# Patient Record
Sex: Male | Born: 1960 | Race: White | Hispanic: No | Smoking: Current every day smoker
Health system: Southern US, Community
[De-identification: ages and names within clinical notes are randomized; demographics above are authoritative.]

## PROBLEM LIST (undated history)

## (undated) DIAGNOSIS — E119 Type 2 diabetes mellitus without complications: Secondary | ICD-10-CM

## (undated) DIAGNOSIS — I10 Essential (primary) hypertension: Secondary | ICD-10-CM

## (undated) DIAGNOSIS — M1990 Unspecified osteoarthritis, unspecified site: Secondary | ICD-10-CM

## (undated) HISTORY — DX: Unspecified osteoarthritis, unspecified site: M19.90

---

## 2004-07-30 ENCOUNTER — Emergency Department (HOSPITAL_COMMUNITY): Admission: EM | Admit: 2004-07-30 | Discharge: 2004-07-30 | Payer: Self-pay | Admitting: Emergency Medicine

## 2005-05-20 ENCOUNTER — Emergency Department (HOSPITAL_COMMUNITY): Admission: EM | Admit: 2005-05-20 | Discharge: 2005-05-21 | Payer: Self-pay | Admitting: Emergency Medicine

## 2006-09-27 ENCOUNTER — Emergency Department (HOSPITAL_COMMUNITY): Admission: EM | Admit: 2006-09-27 | Discharge: 2006-09-27 | Payer: Self-pay | Admitting: Emergency Medicine
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
## 2009-04-03 ENCOUNTER — Observation Stay (HOSPITAL_COMMUNITY): Admission: EM | Admit: 2009-04-03 | Discharge: 2009-04-04 | Payer: Self-pay | Admitting: Emergency Medicine

## 2010-09-23 LAB — CBC
HCT: 41.2 % (ref 39.0–52.0)
HCT: 45.7 % (ref 39.0–52.0)
MCHC: 34.9 g/dL (ref 30.0–36.0)
MCHC: 34.6 g/dL (ref 30.0–36.0)
MCV: 94.2 fL (ref 78.0–100.0)
MCV: 94 fL (ref 78.0–100.0)
Platelets: 178 10*3/uL (ref 150–400)
RBC: 4.38 MIL/uL (ref 4.22–5.81)
RDW: 13.2 % (ref 11.5–15.5)
WBC: 7.4 10*3/uL (ref 4.0–10.5)
WBC: 9.7 10*3/uL (ref 4.0–10.5)

## 2010-09-23 LAB — BASIC METABOLIC PANEL
CO2: 28 mEq/L (ref 19–32)
Chloride: 107 mEq/L (ref 96–112)
GFR calc Af Amer: >60 mL/min (ref >60)
Potassium: 3.9 mEq/L (ref 3.5–5.1)

## 2010-09-23 LAB — SALICYLATE LEVEL: Salicylate Lvl: <4 mg/dL (ref 2.8–20.0)

## 2010-09-23 LAB — COMPREHENSIVE METABOLIC PANEL
Albumin: 4.5 g/dL (ref 3.5–5.2)
BUN: 17 mg/dL (ref 6–23)
Chloride: 105 mEq/L (ref 96–112)
Creatinine, Ser: 1.16 mg/dL (ref 0.4–1.5)
Total Bilirubin: 0.6 mg/dL (ref 0.3–1.2)

## 2010-09-23 LAB — DIFFERENTIAL
Basophils Absolute: 0.1 10*3/uL (ref 0.0–0.1)
Eosinophils Absolute: 0.1 10*3/uL (ref 0.0–0.7)
Eosinophils Relative: 2 % (ref 0–5)
Lymphocytes Relative: 18 % (ref 12–46)
Lymphs Abs: 2 10*3/uL (ref 0.7–4.0)
Monocytes Absolute: 0.6 10*3/uL (ref 0.1–1.0)
Monocytes Absolute: 0.7 10*3/uL (ref 0.1–1.0)
Monocytes Relative: 8 % (ref 3–12)
Neutro Abs: 7 10*3/uL (ref 1.7–7.7)

## 2010-09-23 LAB — GLUCOSE, CAPILLARY

## 2010-09-23 LAB — RAPID URINE DRUG SCREEN, HOSP PERFORMED
Amphetamines: NOT DETECTED
Barbiturates: NOT DETECTED
Benzodiazepines: POSITIVE — AB
Cocaine: NOT DETECTED
Opiates: NOT DETECTED

## 2011-11-11 ENCOUNTER — Telehealth: Payer: Self-pay

## 2011-11-11 DIAGNOSIS — Z139 Encounter for screening, unspecified: Secondary | ICD-10-CM

## 2011-11-11 NOTE — Telephone Encounter (Signed)
Day of prep, take 1/2 dose glipizide.

## 2011-11-11 NOTE — Telephone Encounter (Addendum)
Gastroenterology Pre-Procedure Form  Pt said he drinks a 12 pk beer on week-ends   Request Date: 11/11/2011     Requesting Physician: Dr. Regino Schultze     PATIENT INFORMATION:  Jason Graham is a 51 y.o., male (DOB=Jun 13, 1961).  PROCEDURE: Procedure(s) requested: colonoscopy Procedure Reason: screening for colon cancer  PATIENT REVIEW QUESTIONS: The patient reports the following:   1. Diabetes Melitis: yes  2. Joint replacements in the past 12 months: no 3. Major health problems in the past 3 months: no 4. Has an artificial valve or MVP:no 5. Has been advised in past to take antibiotics in advance of a procedure like teeth cleaning: no}    MEDICATIONS & ALLERGIES:    Patient reports the following regarding taking any blood thinners:   Plavix? no Aspirin?no Coumadin?  no  Patient confirms/reports the following medications:  Current Outpatient Prescriptions  Medication Sig Dispense Refill  . ALPRAZolam (XANAX) 0.5 MG tablet Take 0.5 mg by mouth at bedtime as needed.      Marland Kitchen glipiZIDE (GLUCOTROL) 5 MG tablet Take 5 mg by mouth 1 day or 1 dose.      . indomethacin (INDOCIN) 25 MG capsule Take 25 mg by mouth 1 day or 1 dose.      Marland Kitchen lisinopril (PRINIVIL,ZESTRIL) 10 MG tablet Take 10 mg by mouth daily.      . meloxicam (MOBIC) 7.5 MG tablet Take 7.5 mg by mouth daily. Two tabs daily      . pravastatin (PRAVACHOL) 20 MG tablet Take 20 mg by mouth daily.        Patient confirms/reports the following allergies:  No Known Allergies  Patient is appropriate to schedule for requested procedure(s):  AUTHORIZATION INFORMATION Primary Insurance:   ID #:   Group #:  Pre-Cert / Auth required: Pre-Cert / Auth #:   Secondary Insurance:   ID #:   Group #:  Pre-Cert / Auth required:  Pre-Cert / Auth #:  No orders of the defined types were placed in this encounter.    SCHEDULE INFORMATION: Procedure has been scheduled as follows:  Date: 12/14/2011                 Time:  8:30 AM Location:  [facility] Short Stay  This Gastroenterology Pre-Precedure Form is being routed to the following provider(s) for review: R. Roetta Sessions, MD    Rx and instructions mailed to pt.

## 2011-11-15 ENCOUNTER — Other Ambulatory Visit: Payer: Self-pay

## 2011-11-15 DIAGNOSIS — Z139 Encounter for screening, unspecified: Secondary | ICD-10-CM

## 2011-11-15 MED ORDER — PEG 3350-KCL-NA BICARB-NACL 420 G PO SOLR: ORAL | Status: AC

## 2011-11-29 ENCOUNTER — Telehealth: Payer: Self-pay

## 2011-11-29 NOTE — Telephone Encounter (Signed)
LMOM for a return call to update triage.  

## 2011-12-08 NOTE — Telephone Encounter (Signed)
LMOM to call to update triage prior to colonoscopy on 12/14/2011.

## 2011-12-13 NOTE — Telephone Encounter (Signed)
LMOM that I needed to review his meds etc prior to his procedure.

## 2011-12-14 ENCOUNTER — Encounter (HOSPITAL_COMMUNITY): Admission: RE | Payer: Self-pay | Source: Ambulatory Visit

## 2011-12-14 SURGERY — COLONOSCOPY
Anesthesia: Moderate Sedation

## 2011-12-14 MED ORDER — SODIUM CHLORIDE 0.45 % IV SOLN: Freq: Once | INTRAVENOUS | Status: DC

## 2011-12-15 ENCOUNTER — Ambulatory Visit (HOSPITAL_COMMUNITY)
Admission: RE | Admit: 2011-12-15 | Payer: BC Managed Care – PPO | Source: Ambulatory Visit | Admitting: Internal Medicine

## 2011-12-16 ENCOUNTER — Telehealth: Payer: Self-pay

## 2011-12-16 NOTE — Telephone Encounter (Signed)
Received a phone call from Selena Batten in Endo that pt was a no show for his colonoscopy on 01/13/2012. I had been unable to contact him by phone to update triage. i am mailing a letter to pt and also sending a letter to Dr. Regino Schultze.

## 2013-04-07 ENCOUNTER — Encounter (HOSPITAL_COMMUNITY): Admission: EM | Disposition: A | Discharge status: Home / Self Care | Payer: Self-pay | Source: Home / Self Care

## 2013-04-07 ENCOUNTER — Encounter (HOSPITAL_COMMUNITY): Payer: No Typology Code available for payment source | Admitting: Anesthesiology

## 2013-04-07 ENCOUNTER — Emergency Department (HOSPITAL_COMMUNITY): Payer: No Typology Code available for payment source

## 2013-04-07 ENCOUNTER — Inpatient Hospital Stay (HOSPITAL_COMMUNITY)
Admission: EM | Admit: 2013-04-07 | Discharge: 2013-04-11 | DRG: 330 | Disposition: A | Discharge status: Home / Self Care | Payer: No Typology Code available for payment source | Attending: General Surgery | Admitting: General Surgery

## 2013-04-07 ENCOUNTER — Encounter (HOSPITAL_COMMUNITY): Payer: Self-pay | Admitting: Emergency Medicine

## 2013-04-07 ENCOUNTER — Emergency Department (HOSPITAL_COMMUNITY): Payer: No Typology Code available for payment source | Admitting: Anesthesiology

## 2013-04-07 DIAGNOSIS — IMO0002 Reserved for concepts with insufficient information to code with codable children: Secondary | ICD-10-CM

## 2013-04-07 DIAGNOSIS — F17200 Nicotine dependence, unspecified, uncomplicated: Secondary | ICD-10-CM | POA: Diagnosis present

## 2013-04-07 DIAGNOSIS — S31109A Unspecified open wound of abdominal wall, unspecified quadrant without penetration into peritoneal cavity, initial encounter: Secondary | ICD-10-CM

## 2013-04-07 DIAGNOSIS — S3681XA Injury of peritoneum, initial encounter: Secondary | ICD-10-CM

## 2013-04-07 DIAGNOSIS — L408 Other psoriasis: Secondary | ICD-10-CM | POA: Diagnosis present

## 2013-04-07 DIAGNOSIS — E119 Type 2 diabetes mellitus without complications: Secondary | ICD-10-CM | POA: Diagnosis present

## 2013-04-07 DIAGNOSIS — I10 Essential (primary) hypertension: Secondary | ICD-10-CM | POA: Diagnosis present

## 2013-04-07 DIAGNOSIS — Y9229 Other specified public building as the place of occurrence of the external cause: Secondary | ICD-10-CM

## 2013-04-07 DIAGNOSIS — D62 Acute posthemorrhagic anemia: Secondary | ICD-10-CM | POA: Diagnosis present

## 2013-04-07 DIAGNOSIS — S31119A Laceration without foreign body of abdominal wall, unspecified quadrant without penetration into peritoneal cavity, initial encounter: Secondary | ICD-10-CM

## 2013-04-07 HISTORY — DX: Essential (primary) hypertension: I10

## 2013-04-07 HISTORY — PX: LAPAROTOMY: SHX154

## 2013-04-07 HISTORY — DX: Type 2 diabetes mellitus without complications: E11.9

## 2013-04-07 LAB — TYPE AND SCREEN
ABO/RH(D): A POS
Antibody Screen: NEGATIVE
Unit division: 0
Unit division: 0

## 2013-04-07 LAB — GLUCOSE, CAPILLARY: Glucose-Capillary: 335 mg/dL — ABNORMAL HIGH (ref 70–99)

## 2013-04-07 SURGERY — LAPAROTOMY, EXPLORATORY
Anesthesia: General | Site: Abdomen | Wound class: Dirty or Infected

## 2013-04-07 MED ORDER — PROPOFOL 10 MG/ML IV BOLUS
INTRAVENOUS | Status: DC | PRN
  Administered 2013-04-07: 200 mg via INTRAVENOUS

## 2013-04-07 MED ORDER — NALOXONE HCL 0.4 MG/ML IJ SOLN: 0.4000 mg | INTRAMUSCULAR | Status: DC | PRN

## 2013-04-07 MED ORDER — PROMETHAZINE HCL 25 MG/ML IJ SOLN: 6.2500 mg | INTRAMUSCULAR | Status: DC | PRN

## 2013-04-07 MED ORDER — ENOXAPARIN SODIUM 40 MG/0.4ML ~~LOC~~ SOLN
40.0000 mg | SUBCUTANEOUS | Status: DC
  Filled 2013-04-07: qty 0.4

## 2013-04-07 MED ORDER — POTASSIUM CHLORIDE IN NACL 20-0.9 MEQ/L-% IV SOLN
INTRAVENOUS | Status: DC
  Administered 2013-04-08 (×3): via INTRAVENOUS
  Administered 2013-04-09: 50 mL/h via INTRAVENOUS
  Administered 2013-04-09 – 2013-04-10 (×2): via INTRAVENOUS
  Administered 2013-04-10: 20 mL/h via INTRAVENOUS
  Filled 2013-04-07 (×9): qty 1000

## 2013-04-07 MED ORDER — OXYCODONE HCL 5 MG PO TABS: 5.0000 mg | ORAL_TABLET | Freq: Once | ORAL | Status: AC | PRN

## 2013-04-07 MED ORDER — SUCCINYLCHOLINE CHLORIDE 20 MG/ML IJ SOLN
INTRAMUSCULAR | Status: DC | PRN
  Administered 2013-04-07: 140 mg via INTRAVENOUS

## 2013-04-07 MED ORDER — 0.9 % SODIUM CHLORIDE (POUR BTL) OPTIME
TOPICAL | Status: DC | PRN
  Administered 2013-04-07: 3000 mL

## 2013-04-07 MED ORDER — ONDANSETRON HCL 4 MG/2ML IJ SOLN
INTRAMUSCULAR | Status: DC | PRN
  Administered 2013-04-07: 4 mg via INTRAMUSCULAR

## 2013-04-07 MED ORDER — MORPHINE SULFATE (PF) 1 MG/ML IV SOLN
INTRAVENOUS | Status: DC
  Administered 2013-04-08: 21 mg via INTRAVENOUS
  Administered 2013-04-08: 01:00:00 via INTRAVENOUS
  Administered 2013-04-08: 10.5 mg via INTRAVENOUS
  Administered 2013-04-08: 1 mg via INTRAVENOUS
  Administered 2013-04-08: 0.6 mg via INTRAVENOUS
  Administered 2013-04-08: 8.93 mg via INTRAVENOUS
  Administered 2013-04-08: 11:00:00 via INTRAVENOUS
  Administered 2013-04-08: 20.38 mg via INTRAVENOUS
  Administered 2013-04-09: 6 mg via INTRAVENOUS
  Administered 2013-04-09: 9 mL via INTRAVENOUS
  Administered 2013-04-09: 11:00:00 via INTRAVENOUS
  Administered 2013-04-09: 6 mg via INTRAVENOUS
  Filled 2013-04-07 (×4): qty 25

## 2013-04-07 MED ORDER — HYDROMORPHONE HCL PF 1 MG/ML IJ SOLN
0.2500 mg | INTRAMUSCULAR | Status: DC | PRN
  Administered 2013-04-08 (×2): 0.5 mg via INTRAVENOUS

## 2013-04-07 MED ORDER — FENTANYL CITRATE 0.05 MG/ML IJ SOLN
INTRAMUSCULAR | Status: DC | PRN
  Administered 2013-04-07: 50 ug via INTRAVENOUS
  Administered 2013-04-07: 100 ug via INTRAVENOUS
  Administered 2013-04-07 (×4): 50 ug via INTRAVENOUS

## 2013-04-07 MED ORDER — ALBUMIN HUMAN 5 % IV SOLN
INTRAVENOUS | Status: DC | PRN
  Administered 2013-04-07: 23:00:00 via INTRAVENOUS

## 2013-04-07 MED ORDER — VECURONIUM BROMIDE 10 MG IV SOLR
INTRAVENOUS | Status: DC | PRN
  Administered 2013-04-07: 3 mg via INTRAVENOUS

## 2013-04-07 MED ORDER — DIPHENHYDRAMINE HCL 50 MG/ML IJ SOLN: 12.5000 mg | Freq: Four times a day (QID) | INTRAMUSCULAR | Status: DC | PRN

## 2013-04-07 MED ORDER — NEOSTIGMINE METHYLSULFATE 1 MG/ML IJ SOLN
INTRAMUSCULAR | Status: DC | PRN
  Administered 2013-04-07: 4 mg via INTRAVENOUS

## 2013-04-07 MED ORDER — CEFAZOLIN SODIUM 1-5 GM-% IV SOLN
INTRAVENOUS | Status: AC
  Filled 2013-04-07: qty 100

## 2013-04-07 MED ORDER — ARTIFICIAL TEARS OP OINT
TOPICAL_OINTMENT | OPHTHALMIC | Status: DC | PRN
  Administered 2013-04-07: 1 via OPHTHALMIC

## 2013-04-07 MED ORDER — OXYCODONE HCL 5 MG/5ML PO SOLN: 5.0000 mg | Freq: Once | ORAL | Status: AC | PRN

## 2013-04-07 MED ORDER — ONDANSETRON HCL 4 MG/2ML IJ SOLN: 4.0000 mg | Freq: Four times a day (QID) | INTRAMUSCULAR | Status: DC | PRN

## 2013-04-07 MED ORDER — INSULIN ASPART 100 UNIT/ML ~~LOC~~ SOLN
0.0000 [IU] | SUBCUTANEOUS | Status: DC
  Administered 2013-04-08: 4 [IU] via SUBCUTANEOUS
  Administered 2013-04-08: 7 [IU] via SUBCUTANEOUS
  Administered 2013-04-08: 15 [IU] via SUBCUTANEOUS
  Administered 2013-04-08: 3 [IU] via SUBCUTANEOUS
  Administered 2013-04-08: 11 [IU] via SUBCUTANEOUS
  Administered 2013-04-08: 7 [IU] via SUBCUTANEOUS
  Administered 2013-04-09 (×2): 3 [IU] via SUBCUTANEOUS
  Administered 2013-04-09 (×2): 4 [IU] via SUBCUTANEOUS
  Administered 2013-04-09 (×2): 3 [IU] via SUBCUTANEOUS
  Administered 2013-04-10: 4 [IU] via SUBCUTANEOUS
  Administered 2013-04-10: 7 [IU] via SUBCUTANEOUS
  Administered 2013-04-10: 3 [IU] via SUBCUTANEOUS

## 2013-04-07 MED ORDER — MIDAZOLAM HCL 5 MG/5ML IJ SOLN
INTRAMUSCULAR | Status: DC | PRN
  Administered 2013-04-07: 2 mg via INTRAVENOUS

## 2013-04-07 MED ORDER — SODIUM CHLORIDE 0.9 % IJ SOLN: 9.0000 mL | INTRAMUSCULAR | Status: DC | PRN

## 2013-04-07 MED ORDER — EPHEDRINE SULFATE 50 MG/ML IJ SOLN
INTRAMUSCULAR | Status: DC | PRN
  Administered 2013-04-07: 10 mg via INTRAVENOUS

## 2013-04-07 MED ORDER — PHENYLEPHRINE HCL 10 MG/ML IJ SOLN
INTRAMUSCULAR | Status: DC | PRN
  Administered 2013-04-07 (×3): 80 ug via INTRAVENOUS

## 2013-04-07 MED ORDER — LACTATED RINGERS IV SOLN
INTRAVENOUS | Status: DC | PRN
  Administered 2013-04-07: 22:00:00 via INTRAVENOUS

## 2013-04-07 MED ORDER — LIDOCAINE HCL (CARDIAC) 20 MG/ML IV SOLN
INTRAVENOUS | Status: DC | PRN
  Administered 2013-04-07: 50 mg via INTRAVENOUS

## 2013-04-07 MED ORDER — CEFAZOLIN SODIUM-DEXTROSE 2-3 GM-% IV SOLR
2.0000 g | INTRAVENOUS | Status: DC
  Filled 2013-04-07: qty 50

## 2013-04-07 MED ORDER — CEFAZOLIN SODIUM-DEXTROSE 2-3 GM-% IV SOLR
2.0000 g | Freq: Once | INTRAVENOUS | Status: AC
  Administered 2013-04-07: 2 g via INTRAVENOUS

## 2013-04-07 MED ORDER — GLYCOPYRROLATE 0.2 MG/ML IJ SOLN
INTRAMUSCULAR | Status: DC | PRN
  Administered 2013-04-07: 0.6 mg via INTRAVENOUS

## 2013-04-07 MED ORDER — DIPHENHYDRAMINE HCL 12.5 MG/5ML PO ELIX: 12.5000 mg | ORAL_SOLUTION | Freq: Four times a day (QID) | ORAL | Status: DC | PRN

## 2013-04-07 SURGICAL SUPPLY — 49 items
BLADE SURG ROTATE 9660 (MISCELLANEOUS) ×2 IMPLANT
CANISTER SUCTION 2500CC (MISCELLANEOUS) ×2 IMPLANT
COVER MAYO STAND STRL (DRAPES) IMPLANT
COVER SURGICAL LIGHT HANDLE (MISCELLANEOUS) ×2 IMPLANT
DRAPE LAPAROSCOPIC ABDOMINAL (DRAPES) ×2 IMPLANT
DRAPE PROXIMA HALF (DRAPES) IMPLANT
DRAPE UTILITY 15X26 W/TAPE STR (DRAPE) ×4 IMPLANT
DRAPE WARM FLUID 44X44 (DRAPE) ×2 IMPLANT
DRSG OPSITE POSTOP 4X10 (GAUZE/BANDAGES/DRESSINGS) IMPLANT
DRSG OPSITE POSTOP 4X8 (GAUZE/BANDAGES/DRESSINGS) IMPLANT
DRSG PAD ABDOMINAL 8X10 ST (GAUZE/BANDAGES/DRESSINGS) ×4 IMPLANT
ELECT BLADE 6.5 EXT (BLADE) IMPLANT
ELECT CAUTERY BLADE 6.4 (BLADE) ×2 IMPLANT
ELECT REM PT RETURN 9FT ADLT (ELECTROSURGICAL) ×2
ELECTRODE REM PT RTRN 9FT ADLT (ELECTROSURGICAL) ×1 IMPLANT
GLOVE BIO SURGEON STRL SZ8 (GLOVE) ×2 IMPLANT
GLOVE BIOGEL PI IND STRL 7.0 (GLOVE) ×1 IMPLANT
GLOVE BIOGEL PI IND STRL 8 (GLOVE) ×2 IMPLANT
GLOVE BIOGEL PI INDICATOR 7.0 (GLOVE) ×1
GLOVE BIOGEL PI INDICATOR 8 (GLOVE) ×2
GLOVE SURG SIGNA 7.5 PF LTX (GLOVE) ×2 IMPLANT
GOWN STRL NON-REIN LRG LVL3 (GOWN DISPOSABLE) IMPLANT
GOWN STRL REIN 3XL LVL4 (GOWN DISPOSABLE) ×2 IMPLANT
GOWN STRL REIN XL XLG (GOWN DISPOSABLE) ×2 IMPLANT
KIT BASIN OR (CUSTOM PROCEDURE TRAY) ×2 IMPLANT
KIT ROOM TURNOVER OR (KITS) ×2 IMPLANT
LIGASURE IMPACT 36 18CM CVD LR (INSTRUMENTS) IMPLANT
NS IRRIG 1000ML POUR BTL (IV SOLUTION) ×2 IMPLANT
PACK GENERAL/GYN (CUSTOM PROCEDURE TRAY) ×2 IMPLANT
PAD ARMBOARD 7.5X6 YLW CONV (MISCELLANEOUS) ×2 IMPLANT
PENCIL BUTTON HOLSTER BLD 10FT (ELECTRODE) IMPLANT
SPECIMEN JAR LARGE (MISCELLANEOUS) IMPLANT
SPONGE GAUZE 4X4 12PLY (GAUZE/BANDAGES/DRESSINGS) ×4 IMPLANT
SPONGE LAP 18X18 X RAY DECT (DISPOSABLE) ×4 IMPLANT
STAPLER VISISTAT 35W (STAPLE) ×2 IMPLANT
SUCTION POOLE TIP (SUCTIONS) ×2 IMPLANT
SUT NOVA NAB DX-16 0-1 5-0 T12 (SUTURE) ×4 IMPLANT
SUT PDS AB 1 TP1 96 (SUTURE) ×4 IMPLANT
SUT SILK 2 0 SH CR/8 (SUTURE) IMPLANT
SUT SILK 2 0 TIES 10X30 (SUTURE) IMPLANT
SUT SILK 3 0 SH CR/8 (SUTURE) ×4 IMPLANT
SUT SILK 3 0 TIES 10X30 (SUTURE) ×2 IMPLANT
SUT VIC AB 3-0 SH 18 (SUTURE) IMPLANT
TAPE CLOTH SURG 4X10 WHT LF (GAUZE/BANDAGES/DRESSINGS) ×2 IMPLANT
TOWEL OR 17X24 6PK STRL BLUE (TOWEL DISPOSABLE) IMPLANT
TOWEL OR 17X26 10 PK STRL BLUE (TOWEL DISPOSABLE) ×2 IMPLANT
TRAY FOLEY CATH 16FRSI W/METER (SET/KITS/TRAYS/PACK) ×2 IMPLANT
TUBE CONNECTING 12X1/4 (SUCTIONS) IMPLANT
YANKAUER SUCT BULB TIP NO VENT (SUCTIONS) IMPLANT

## 2013-04-07 NOTE — Anesthesia Preprocedure Evaluation (Addendum)
Anesthesia Evaluation  Patient identified by MRN, date of birth, ID band Patient awake    Reviewed: Allergy & Precautions, H&P , NPO status , Patient's Chart, lab work & pertinent test results  Airway Mallampati: II TM Distance: <3 FB Neck ROM: Limited    Dental   Pulmonary COPDCurrent Smoker,  + rhonchi         Cardiovascular hypertension, Pt. on medications Rhythm:Regular Rate:Normal     Neuro/Psych    GI/Hepatic   Endo/Other  diabetes, Poorly Controlled, Type 2, Oral Hypoglycemic AgentsMorbid obesity  Renal/GU      Musculoskeletal   Abdominal (+) + obese,   Peds  Hematology   Anesthesia Other Findings   Reproductive/Obstetrics                          Anesthesia Physical Anesthesia Plan  ASA: III and emergent  Anesthesia Plan: General   Post-op Pain Management:    Induction: Intravenous, Rapid sequence and Cricoid pressure planned  Airway Management Planned: Oral ETT  Additional Equipment:   Intra-op Plan:   Post-operative Plan: Extubation in OR  Informed Consent: I have reviewed the patients History and Physical, chart, labs and discussed the procedure including the risks, benefits and alternatives for the proposed anesthesia with the patient or authorized representative who has indicated his/her understanding and acceptance.   History available from chart only  Plan Discussed with: CRNA and Surgeon  Anesthesia Plan Comments:        Anesthesia Quick Evaluation
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
## 2013-04-07 NOTE — Transfer of Care (Signed)
Immediate Anesthesia Transfer of Care Note  Patient: Jason Graham  Procedure(s) Performed: Procedure(s): EXPLORATORY LAPAROTOMY, REPAIR OF SMALL BOWEL ENTEROTOMY, REPAIR OF FASCIAL DEFECT (N/A)  Patient Location: PACU  Anesthesia Type:General  Level of Consciousness: awake  Airway & Oxygen Therapy: Patient Spontanous Breathing and Patient connected to nasal cannula oxygen  Post-op Assessment: Report given to PACU RN, Post -op Vital signs reviewed and stable and Patient moving all extremities  Post vital signs: Reviewed and stable  Complications: No apparent anesthesia complications
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
## 2013-04-07 NOTE — Anesthesia Procedure Notes (Signed)
Procedure Name: Intubation Date/Time: 04/07/2013 10:16 PM Performed by: Luster Landsberg Pre-anesthesia Checklist: Patient identified, Emergency Drugs available, Suction available and Patient being monitored Patient Re-evaluated:Patient Re-evaluated prior to inductionOxygen Delivery Method: Circle system utilized Preoxygenation: Pre-oxygenation with 100% oxygen Intubation Type: IV induction, Rapid sequence and Cricoid Pressure applied Laryngoscope Size: Mac and 3 Grade View: Grade II Tube type: Oral Tube size: 7.5 mm Number of attempts: 2 Airway Equipment and Method: Video-laryngoscopy and Stylet Placement Confirmation: ETT inserted through vocal cords under direct vision,  positive ETCO2 and breath sounds checked- equal and bilateral Secured at: 22 cm Tube secured with: Tape Dental Injury: Teeth and Oropharynx as per pre-operative assessment  Comments: Esophageal intubation with 1st DVL with MAC3 - DVL second time with glidescope.  +ETCO@ +/= BS. Teeth lips as preop.  Cricoid pressure maintained throughout procedure

## 2013-04-07 NOTE — Anesthesia Postprocedure Evaluation (Signed)
  Anesthesia Post-op Note  Patient: Jason Graham  Procedure(s) Performed: Procedure(s): EXPLORATORY LAPAROTOMY, REPAIR OF SMALL BOWEL ENTEROTOMY, REPAIR OF FASCIAL DEFECT (N/A)  Patient Location: PACU  Anesthesia Type:General  Level of Consciousness: awake  Airway and Oxygen Therapy: Patient Spontanous Breathing  Post-op Pain: none  Post-op Assessment: Post-op Vital signs reviewed, Patient's Cardiovascular Status Stable, Respiratory Function Stable, Patent Airway, No signs of Nausea or vomiting and Pain level controlled  Post-op Vital Signs: Reviewed and stable  Complications: No apparent anesthesia complications
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
## 2013-04-08 ENCOUNTER — Encounter (HOSPITAL_COMMUNITY): Payer: Self-pay | Admitting: Surgery

## 2013-04-08 DIAGNOSIS — E119 Type 2 diabetes mellitus without complications: Secondary | ICD-10-CM | POA: Insufficient documentation

## 2013-04-08 DIAGNOSIS — S3681XA Injury of peritoneum, initial encounter: Secondary | ICD-10-CM

## 2013-04-08 DIAGNOSIS — L409 Psoriasis, unspecified: Secondary | ICD-10-CM | POA: Insufficient documentation

## 2013-04-08 DIAGNOSIS — D62 Acute posthemorrhagic anemia: Secondary | ICD-10-CM

## 2013-04-08 DIAGNOSIS — S31119A Laceration without foreign body of abdominal wall, unspecified quadrant without penetration into peritoneal cavity, initial encounter: Secondary | ICD-10-CM

## 2013-04-08 DIAGNOSIS — IMO0002 Reserved for concepts with insufficient information to code with codable children: Secondary | ICD-10-CM

## 2013-04-08 LAB — CBC
HCT: 37.8 % — ABNORMAL LOW (ref 39.0–52.0)
Hemoglobin: 12.9 g/dL — ABNORMAL LOW (ref 13.0–17.0)
MCV: 91.5 fL (ref 78.0–100.0)
RBC: 4.13 MIL/uL — ABNORMAL LOW (ref 4.22–5.81)
RDW: 12.6 % (ref 11.5–15.5)
WBC: 9 10*3/uL (ref 4.0–10.5)

## 2013-04-08 LAB — GLUCOSE, CAPILLARY
Glucose-Capillary: 130 mg/dL — ABNORMAL HIGH (ref 70–99)
Glucose-Capillary: 143 mg/dL — ABNORMAL HIGH (ref 70–99)
Glucose-Capillary: 211 mg/dL — ABNORMAL HIGH (ref 70–99)

## 2013-04-08 LAB — ABO/RH: ABO/RH(D): A POS

## 2013-04-08 LAB — BASIC METABOLIC PANEL
Chloride: 101 mEq/L (ref 96–112)
GFR calc Af Amer: >90 mL/min (ref >90)
Potassium: 4.3 mEq/L (ref 3.5–5.1)
Sodium: 137 mEq/L (ref 135–145)

## 2013-04-08 MED ORDER — INSULIN ASPART 100 UNIT/ML ~~LOC~~ SOLN
SUBCUTANEOUS | Status: AC
  Filled 2013-04-08: qty 15

## 2013-04-08 MED ORDER — PANTOPRAZOLE SODIUM 40 MG IV SOLR
40.0000 mg | Freq: Every day | INTRAVENOUS | Status: DC
  Administered 2013-04-08 – 2013-04-09 (×3): 40 mg via INTRAVENOUS
  Filled 2013-04-08 (×4): qty 40

## 2013-04-08 MED ORDER — CEFAZOLIN SODIUM-DEXTROSE 2-3 GM-% IV SOLR
2.0000 g | Freq: Three times a day (TID) | INTRAVENOUS | Status: DC
  Administered 2013-04-08: 2 g via INTRAVENOUS
  Filled 2013-04-08 (×3): qty 50

## 2013-04-08 MED ORDER — HYDROMORPHONE HCL PF 1 MG/ML IJ SOLN
INTRAMUSCULAR | Status: AC
  Filled 2013-04-08: qty 1

## 2013-04-08 MED ORDER — MORPHINE SULFATE (PF) 1 MG/ML IV SOLN
INTRAVENOUS | Status: AC
  Filled 2013-04-08: qty 25

## 2013-04-08 MED ORDER — INSULIN DETEMIR 100 UNIT/ML ~~LOC~~ SOLN
20.0000 [IU] | Freq: Every day | SUBCUTANEOUS | Status: DC
  Administered 2013-04-09 (×2): 20 [IU] via SUBCUTANEOUS
  Filled 2013-04-08 (×3): qty 0.2

## 2013-04-08 MED ORDER — ONDANSETRON HCL 4 MG/2ML IJ SOLN: 4.0000 mg | Freq: Four times a day (QID) | INTRAMUSCULAR | Status: DC | PRN

## 2013-04-08 MED ORDER — CEFAZOLIN SODIUM-DEXTROSE 2-3 GM-% IV SOLR
2.0000 g | Freq: Three times a day (TID) | INTRAVENOUS | Status: DC
  Filled 2013-04-08 (×3): qty 50

## 2013-04-08 MED ORDER — ENOXAPARIN SODIUM 30 MG/0.3ML ~~LOC~~ SOLN
30.0000 mg | Freq: Two times a day (BID) | SUBCUTANEOUS | Status: DC
  Administered 2013-04-08 – 2013-04-11 (×7): 30 mg via SUBCUTANEOUS
  Filled 2013-04-08 (×7): qty 0.3
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
## 2013-04-08 NOTE — Progress Notes (Signed)
Glucose is high, will start some Levemir.  Large abdominal dressing with lots of tape.  Readjusted.  Will change again tomorrow.  This patient has been seen and I agree with the findings and treatment plan.  Marta Lamas. Gae Bon, MD, FACS [phone] (pager) [phone] (direct pager) Trauma Surgeon
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
## 2013-04-08 NOTE — ED Provider Notes (Signed)
I saw and evaluated the patient, reviewed the resident's note and I agree with the findings and plan.    Candyce Churn, MD 04/08/13 1300

## 2013-04-08 NOTE — Progress Notes (Signed)
Inpatient Diabetes Program Recommendations  AACE/ADA: New Consensus Statement on Inpatient Glycemic Control (2013)  Target Ranges:  Prepandial:   less than 140 mg/dL      Peak postprandial:   less than 180 mg/dL (1-2 hours)      Critically ill patients:  140 - 180 mg/dL   Hyperglycemia: Pt is medicaid potential. Pt would be a candidate for the [facility] Will request care management consult.  While here: Inpatient Diabetes Program Recommendations Insulin - Basal: Please consider addition of basal lantus at 10-15 units daily or HS  Thank you, Lenor Coffin, RN, CNS, Diabetes Coordinator [phone])

## 2013-04-08 NOTE — Care Management Note (Signed)
  Page 1 of 1   04/08/2013     3:25:23 PM   CARE MANAGEMENT NOTE 04/08/2013  Patient:  Jason Graham, Jason Graham   Account Number:  [id number]  Date Initiated:  04/08/2013  Documentation initiated by:  Ronny Flurry  Subjective/Objective Assessment:     Action/Plan:   Anticipated DC Date:     Anticipated DC Plan:           Choice offered to / List presented to:             Status of service:   Medicare Important Message given?   (If response is "NO", the following Medicare IM given date fields will be blank) Date Medicare IM given:   Date Additional Medicare IM given:    Discharge Disposition:    Per UR Regulation:    If discussed at Long Length of Stay Meetings, dates discussed:    Comments:  04-08-13 Patient wants to have follow up appointment at [facility] and [facility] . Information provided . Emailed  [facility] for appointment will let patient know when have response . Ronny Flurry RN BSN [phone]

## 2013-04-09 LAB — GLUCOSE, CAPILLARY
Glucose-Capillary: 140 mg/dL — ABNORMAL HIGH (ref 70–99)
Glucose-Capillary: 138 mg/dL — ABNORMAL HIGH (ref 70–99)
Glucose-Capillary: 174 mg/dL — ABNORMAL HIGH (ref 70–99)

## 2013-04-09 LAB — CBC
HCT: 38.7 % — ABNORMAL LOW (ref 39.0–52.0)
Hemoglobin: 13 g/dL (ref 13.0–17.0)
MCH: 31.9 pg (ref 26.0–34.0)
MCHC: 33.6 g/dL (ref 30.0–36.0)
RDW: 12.9 % (ref 11.5–15.5)

## 2013-04-09 MED ORDER — MORPHINE SULFATE 2 MG/ML IJ SOLN: 2.0000 mg | INTRAMUSCULAR | Status: DC | PRN

## 2013-04-09 MED ORDER — BIOTENE DRY MOUTH MT LIQD
15.0000 mL | Freq: Two times a day (BID) | OROMUCOSAL | Status: DC
  Administered 2013-04-09 – 2013-04-10 (×4): 15 mL via OROMUCOSAL

## 2013-04-09 MED ORDER — LISINOPRIL 10 MG PO TABS
10.0000 mg | ORAL_TABLET | Freq: Every day | ORAL | Status: DC
  Administered 2013-04-09 – 2013-04-11 (×3): 10 mg via ORAL
  Filled 2013-04-09 (×3): qty 1

## 2013-04-09 MED ORDER — BISOPROLOL-HYDROCHLOROTHIAZIDE 5-6.25 MG PO TABS
1.0000 | ORAL_TABLET | Freq: Every day | ORAL | Status: DC
  Administered 2013-04-10 – 2013-04-11 (×2): 1 via ORAL
  Filled 2013-04-09 (×2): qty 1

## 2013-04-09 MED ORDER — OXYCODONE HCL 5 MG PO TABS
15.0000 mg | ORAL_TABLET | ORAL | Status: DC | PRN
Start: 2013-04-09 — End: 2013-04-11
  Administered 2013-04-09: 15 mg via ORAL

## 2013-04-09 MED ORDER — NAPROXEN 500 MG PO TABS
500.0000 mg | ORAL_TABLET | Freq: Two times a day (BID) | ORAL | Status: DC
  Administered 2013-04-09 – 2013-04-11 (×4): 500 mg via ORAL
  Filled 2013-04-09 (×7): qty 1

## 2013-04-09 MED ORDER — GLIPIZIDE 5 MG PO TABS
5.0000 mg | ORAL_TABLET | Freq: Two times a day (BID) | ORAL | Status: DC
  Administered 2013-04-09 – 2013-04-11 (×4): 5 mg via ORAL
  Filled 2013-04-09 (×7): qty 1

## 2013-04-09 MED ORDER — OXYCODONE HCL 5 MG PO TABS
10.0000 mg | ORAL_TABLET | ORAL | Status: DC | PRN
  Administered 2013-04-10 – 2013-04-11 (×2): 10 mg via ORAL
  Filled 2013-04-09 (×2): qty 2

## 2013-04-09 MED ORDER — CHLORHEXIDINE GLUCONATE 0.12 % MT SOLN
15.0000 mL | Freq: Two times a day (BID) | OROMUCOSAL | Status: DC
  Administered 2013-04-09 – 2013-04-11 (×4): 15 mL via OROMUCOSAL
  Filled 2013-04-09 (×4): qty 15

## 2013-04-09 MED ORDER — OXYCODONE HCL 5 MG PO TABS
20.0000 mg | ORAL_TABLET | ORAL | Status: DC | PRN
  Administered 2013-04-09 – 2013-04-11 (×3): 20 mg via ORAL
  Filled 2013-04-09 (×4): qty 4

## 2013-04-09 MED ORDER — OXYCODONE HCL 5 MG PO TABS: 10.0000 mg | ORAL_TABLET | ORAL | Status: DC | PRN
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
## 2013-04-09 NOTE — Progress Notes (Signed)
Tolerating some clears. Up in chair. Patient examined and I agree with the assessment and plan  Violeta Gelinas, MD, MPH, FACS Pager: [phone]  04/09/2013 2:06 PM
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
## 2013-04-10 LAB — GLUCOSE, CAPILLARY
Glucose-Capillary: 102 mg/dL — ABNORMAL HIGH (ref 70–99)
Glucose-Capillary: 109 mg/dL — ABNORMAL HIGH (ref 70–99)
Glucose-Capillary: 215 mg/dL — ABNORMAL HIGH (ref 70–99)

## 2013-04-10 MED ORDER — PANTOPRAZOLE SODIUM 40 MG PO TBEC
40.0000 mg | DELAYED_RELEASE_TABLET | Freq: Every day | ORAL | Status: DC
  Administered 2013-04-10: 40 mg via ORAL
  Filled 2013-04-10: qty 1

## 2013-04-10 MED ORDER — INSULIN DETEMIR 100 UNIT/ML ~~LOC~~ SOLN
15.0000 [IU] | Freq: Two times a day (BID) | SUBCUTANEOUS | Status: DC
  Administered 2013-04-10 – 2013-04-11 (×2): 15 [IU] via SUBCUTANEOUS
  Filled 2013-04-10 (×3): qty 0.15
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
## 2013-04-10 NOTE — Progress Notes (Signed)
Patient doing okay.  No bowel movement.  Some flatus.  CPM otherwise.  May be able to increase diet tomorrow.  This patient has been seen and I agree with the findings and treatment plan.  Marta Lamas. Gae Bon, MD, FACS [phone] (pager) [phone] (direct pager) Trauma Surgeon

## 2013-04-11 LAB — GLUCOSE, CAPILLARY
Glucose-Capillary: 82 mg/dL (ref 70–99)
Glucose-Capillary: 113 mg/dL — ABNORMAL HIGH (ref 70–99)

## 2013-04-11 MED ORDER — DOCUSATE SODIUM 100 MG PO CAPS
100.0000 mg | ORAL_CAPSULE | Freq: Two times a day (BID) | ORAL | Status: DC
  Administered 2013-04-11: 100 mg via ORAL
  Filled 2013-04-11: qty 1

## 2013-04-11 MED ORDER — POLYETHYLENE GLYCOL 3350 17 G PO PACK
17.0000 g | PACK | Freq: Every day | ORAL | Status: DC
  Administered 2013-04-11: 17 g via ORAL
  Filled 2013-04-11: qty 1

## 2013-04-11 MED ORDER — INSULIN ASPART 100 UNIT/ML ~~LOC~~ SOLN
0.0000 [IU] | Freq: Three times a day (TID) | SUBCUTANEOUS | Status: DC
  Administered 2013-04-11: 2 [IU] via SUBCUTANEOUS

## 2013-04-11 MED ORDER — OXYCODONE-ACETAMINOPHEN 10-325 MG PO TABS: 1.0000 | ORAL_TABLET | ORAL | Status: DC | PRN
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
## 2013-04-11 NOTE — Discharge Summary (Signed)
Okay to go home  Ranae Casebier O. Carter Kaman, III, MD, FACS [phone] Trauma Surgeon 
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
## 2013-04-11 NOTE — Progress Notes (Signed)
Discharge home. Home discharge instruction given, no question verbalized. 

## 2013-04-17 ENCOUNTER — Ambulatory Visit (INDEPENDENT_AMBULATORY_CARE_PROVIDER_SITE_OTHER): Payer: Self-pay | Admitting: Orthopedic Surgery

## 2013-04-17 ENCOUNTER — Encounter (INDEPENDENT_AMBULATORY_CARE_PROVIDER_SITE_OTHER): Payer: Self-pay

## 2013-04-17 VITALS — BP 130/90 | HR 72 | Temp 98.9°F | Resp 15 | Ht 68.0 in | Wt 198.6 lb

## 2013-04-17 DIAGNOSIS — Z5189 Encounter for other specified aftercare: Secondary | ICD-10-CM

## 2013-04-17 DIAGNOSIS — S31119D Laceration without foreign body of abdominal wall, unspecified quadrant without penetration into peritoneal cavity, subsequent encounter: Secondary | ICD-10-CM

## 2013-04-17 MED ORDER — TRAMADOL HCL 50 MG PO TABS: 50.0000 mg | ORAL_TABLET | Freq: Four times a day (QID) | ORAL | Status: AC | PRN
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
## 2013-04-17 NOTE — Patient Instructions (Signed)
Continue wound care as instructed. No lifting more than ten pounds for six weeks from date of surgery.

## 2013-05-02 ENCOUNTER — Inpatient Hospital Stay: Payer: Self-pay

## 2018-03-08 ENCOUNTER — Encounter (HOSPITAL_COMMUNITY): Payer: Self-pay | Admitting: Emergency Medicine

## 2018-03-08 ENCOUNTER — Other Ambulatory Visit: Payer: Self-pay

## 2018-03-08 ENCOUNTER — Emergency Department (HOSPITAL_COMMUNITY): Payer: Medicaid - Out of State

## 2018-03-08 ENCOUNTER — Inpatient Hospital Stay (HOSPITAL_COMMUNITY)
Admission: EM | Admit: 2018-03-08 | Discharge: 2018-03-11 | DRG: 603 | Disposition: A | Discharge status: Home / Self Care | Payer: Medicaid - Out of State | Attending: Family Medicine | Admitting: Family Medicine

## 2018-03-08 DIAGNOSIS — L409 Psoriasis, unspecified: Secondary | ICD-10-CM | POA: Diagnosis present

## 2018-03-08 DIAGNOSIS — W57XXXA Bitten or stung by nonvenomous insect and other nonvenomous arthropods, initial encounter: Secondary | ICD-10-CM | POA: Diagnosis present

## 2018-03-08 DIAGNOSIS — Z7984 Long term (current) use of oral hypoglycemic drugs: Secondary | ICD-10-CM

## 2018-03-08 DIAGNOSIS — L03116 Cellulitis of left lower limb: Secondary | ICD-10-CM | POA: Diagnosis not present

## 2018-03-08 DIAGNOSIS — L02416 Cutaneous abscess of left lower limb: Secondary | ICD-10-CM

## 2018-03-08 DIAGNOSIS — Z79899 Other long term (current) drug therapy: Secondary | ICD-10-CM

## 2018-03-08 DIAGNOSIS — E1151 Type 2 diabetes mellitus with diabetic peripheral angiopathy without gangrene: Secondary | ICD-10-CM | POA: Diagnosis present

## 2018-03-08 DIAGNOSIS — Z8249 Family history of ischemic heart disease and other diseases of the circulatory system: Secondary | ICD-10-CM

## 2018-03-08 DIAGNOSIS — M79606 Pain in leg, unspecified: Secondary | ICD-10-CM

## 2018-03-08 DIAGNOSIS — Z791 Long term (current) use of non-steroidal anti-inflammatories (NSAID): Secondary | ICD-10-CM

## 2018-03-08 DIAGNOSIS — Z833 Family history of diabetes mellitus: Secondary | ICD-10-CM

## 2018-03-08 DIAGNOSIS — Z720 Tobacco use: Secondary | ICD-10-CM

## 2018-03-08 DIAGNOSIS — M1990 Unspecified osteoarthritis, unspecified site: Secondary | ICD-10-CM | POA: Diagnosis present

## 2018-03-08 DIAGNOSIS — L02612 Cutaneous abscess of left foot: Principal | ICD-10-CM | POA: Diagnosis present

## 2018-03-08 DIAGNOSIS — E1142 Type 2 diabetes mellitus with diabetic polyneuropathy: Secondary | ICD-10-CM | POA: Diagnosis present

## 2018-03-08 DIAGNOSIS — Z9114 Patient's other noncompliance with medication regimen: Secondary | ICD-10-CM

## 2018-03-08 DIAGNOSIS — S80862A Insect bite (nonvenomous), left lower leg, initial encounter: Secondary | ICD-10-CM | POA: Diagnosis present

## 2018-03-08 DIAGNOSIS — I10 Essential (primary) hypertension: Secondary | ICD-10-CM | POA: Diagnosis not present

## 2018-03-08 DIAGNOSIS — Z23 Encounter for immunization: Secondary | ICD-10-CM

## 2018-03-08 DIAGNOSIS — E1165 Type 2 diabetes mellitus with hyperglycemia: Secondary | ICD-10-CM | POA: Diagnosis present

## 2018-03-08 DIAGNOSIS — E119 Type 2 diabetes mellitus without complications: Secondary | ICD-10-CM

## 2018-03-08 LAB — CBC
HCT: 37.7 % — ABNORMAL LOW (ref 39.0–52.0)
Hemoglobin: 12.7 g/dL — ABNORMAL LOW (ref 13.0–17.0)
MCH: 31.3 pg (ref 26.0–34.0)
MCHC: 33.7 g/dL (ref 30.0–36.0)
MCV: 92.9 fL (ref 78.0–100.0)
Platelets: 380 K/uL (ref 150–400)
RBC: 4.06 MIL/uL — ABNORMAL LOW (ref 4.22–5.81)
RDW: 12.4 % (ref 11.5–15.5)
WBC: 12.1 K/uL — ABNORMAL HIGH (ref 4.0–10.5)

## 2018-03-08 LAB — BASIC METABOLIC PANEL WITH GFR
Anion gap: 9 (ref 5–15)
BUN: 16 mg/dL (ref 6–20)
CO2: 27 mmol/L (ref 22–32)
Calcium: 9.1 mg/dL (ref 8.9–10.3)
Chloride: 103 mmol/L (ref 98–111)
Creatinine, Ser: 0.93 mg/dL (ref 0.61–1.24)
GFR calc Af Amer: >60 mL/min
GFR calc non Af Amer: >60 mL/min
Glucose, Bld: 150 mg/dL — ABNORMAL HIGH (ref 70–99)
Potassium: 4.1 mmol/L (ref 3.5–5.1)
Sodium: 139 mmol/L (ref 135–145)

## 2018-03-08 LAB — GLUCOSE, CAPILLARY: GLUCOSE-CAPILLARY: 224 mg/dL — AB (ref 70–99)

## 2018-03-08 LAB — CG4 I-STAT (LACTIC ACID): Lactic Acid, Venous: 1.85 mmol/L (ref 0.5–1.9)

## 2018-03-08 MED ORDER — VANCOMYCIN HCL IN DEXTROSE 1-5 GM/200ML-% IV SOLN
1000.0000 mg | Freq: Once | INTRAVENOUS | Status: AC
  Administered 2018-03-08: 1000 mg via INTRAVENOUS
  Filled 2018-03-08: qty 200

## 2018-03-08 MED ORDER — VANCOMYCIN HCL IN DEXTROSE 1-5 GM/200ML-% IV SOLN
1000.0000 mg | Freq: Three times a day (TID) | INTRAVENOUS | Status: DC
  Administered 2018-03-09 – 2018-03-11 (×6): 1000 mg via INTRAVENOUS
  Filled 2018-03-08 (×7): qty 200

## 2018-03-08 MED ORDER — PIPERACILLIN-TAZOBACTAM 3.375 G IVPB 30 MIN
3.3750 g | Freq: Once | INTRAVENOUS | Status: AC
  Administered 2018-03-08: 3.375 g via INTRAVENOUS
  Filled 2018-03-08: qty 50

## 2018-03-08 MED ORDER — INSULIN ASPART 100 UNIT/ML ~~LOC~~ SOLN
0.0000 [IU] | Freq: Every day | SUBCUTANEOUS | Status: DC
  Administered 2018-03-08 – 2018-03-10 (×3): 2 [IU] via SUBCUTANEOUS

## 2018-03-08 MED ORDER — PNEUMOCOCCAL VAC POLYVALENT 25 MCG/0.5ML IJ INJ
0.5000 mL | INJECTION | INTRAMUSCULAR | Status: AC
  Administered 2018-03-09: 0.5 mL via INTRAMUSCULAR
  Filled 2018-03-08: qty 0.5

## 2018-03-08 MED ORDER — ACETAMINOPHEN 325 MG PO TABS
650.0000 mg | ORAL_TABLET | Freq: Four times a day (QID) | ORAL | Status: DC | PRN
  Administered 2018-03-09: 650 mg via ORAL
  Filled 2018-03-08: qty 2

## 2018-03-08 MED ORDER — VANCOMYCIN HCL IN DEXTROSE 1-5 GM/200ML-% IV SOLN: 1000.0000 mg | Freq: Once | INTRAVENOUS | Status: DC

## 2018-03-08 MED ORDER — ACETAMINOPHEN 650 MG RE SUPP: 650.0000 mg | Freq: Four times a day (QID) | RECTAL | Status: DC | PRN

## 2018-03-08 MED ORDER — LIDOCAINE-EPINEPHRINE (PF) 2 %-1:200000 IJ SOLN
20.0000 mL | Freq: Once | INTRAMUSCULAR | Status: AC
  Administered 2018-03-08: 20 mL
  Filled 2018-03-08: qty 20

## 2018-03-08 MED ORDER — SENNOSIDES-DOCUSATE SODIUM 8.6-50 MG PO TABS: 1.0000 | ORAL_TABLET | Freq: Every evening | ORAL | Status: DC | PRN

## 2018-03-08 MED ORDER — LISINOPRIL 5 MG PO TABS
5.0000 mg | ORAL_TABLET | Freq: Every day | ORAL | Status: DC
  Administered 2018-03-08 – 2018-03-11 (×4): 5 mg via ORAL
  Filled 2018-03-08 (×4): qty 1

## 2018-03-08 MED ORDER — ENOXAPARIN SODIUM 40 MG/0.4ML ~~LOC~~ SOLN
40.0000 mg | SUBCUTANEOUS | Status: DC
  Administered 2018-03-09 – 2018-03-11 (×3): 40 mg via SUBCUTANEOUS
  Filled 2018-03-08 (×3): qty 0.4

## 2018-03-08 MED ORDER — SODIUM CHLORIDE 0.9 % IV SOLN
INTRAVENOUS | Status: DC | PRN
  Administered 2018-03-09: 500 mL via INTRAVENOUS

## 2018-03-08 MED ORDER — INSULIN ASPART 100 UNIT/ML ~~LOC~~ SOLN
0.0000 [IU] | Freq: Three times a day (TID) | SUBCUTANEOUS | Status: DC
  Administered 2018-03-09: 5 [IU] via SUBCUTANEOUS
  Administered 2018-03-09 – 2018-03-10 (×3): 3 [IU] via SUBCUTANEOUS
  Administered 2018-03-10 (×2): 5 [IU] via SUBCUTANEOUS
  Administered 2018-03-11: 3 [IU] via SUBCUTANEOUS
  Administered 2018-03-11: 5 [IU] via SUBCUTANEOUS
  Administered 2018-03-11: 7 [IU] via SUBCUTANEOUS

## 2018-03-08 MED ORDER — ONDANSETRON HCL 4 MG PO TABS: 4.0000 mg | ORAL_TABLET | Freq: Four times a day (QID) | ORAL | Status: DC | PRN

## 2018-03-08 MED ORDER — ONDANSETRON HCL 4 MG/2ML IJ SOLN: 4.0000 mg | Freq: Four times a day (QID) | INTRAMUSCULAR | Status: DC | PRN

## 2018-03-08 MED ORDER — TRAMADOL HCL 50 MG PO TABS: 50.0000 mg | ORAL_TABLET | Freq: Four times a day (QID) | ORAL | Status: DC | PRN

## 2018-03-08 MED ORDER — AMLODIPINE BESYLATE 5 MG PO TABS: 5.0000 mg | ORAL_TABLET | Freq: Every day | ORAL | Status: DC
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
## 2018-03-08 NOTE — Progress Notes (Addendum)
Pharmacy Note:  Initial antibiotic(s) regimen of Vancomycin and Zosyn ordered by EDP to treat cellulitis / insect bite.  CrCl cannot be calculated (Patient's most recent lab result is older than the maximum 21 days allowed.).   No Known Allergies  Vitals:   03/08/18 1902  BP: (!) 213/99  Pulse: 83  Resp: 18  Temp: 98.9 F (37.2 C)  SpO2: 100%    Anti-infectives (From admission, onward)   Start     Dose/Rate Route Frequency Ordered Stop   03/08/18 2015  piperacillin-tazobactam (ZOSYN) IVPB 3.375 g     3.375 g 100 mL/hr over 30 Minutes Intravenous  Once 03/08/18 2011        Antimicrobials this admission:  Vanc 9/19 >>  Zosyn 9/19 >>   Dose adjustments this admission:  n/a   Microbiology results:  9/19 BCx: pending  UCx:    Sputum:    MRSA PCR:   Plan: Initial dose(s) of Vancomycin and Zosyn X 1 ordered. F/U admission orders for further dosing if therapy continued.  Mady GemmaHayes, Annye Forrey R, [facility] Hospital[facility] 03/08/2018 8:30 PM   Vancomycin continued on admission. Vancomycin 1gm IV every 8 hours. Monitor labs, micro and vitals.   Mady GemmaHayes, Taneika Choi R, [facility] System[facility]PH 03/08/2018 10:06 PM
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
## 2018-03-08 NOTE — ED Provider Notes (Signed)
Medical screening examination/treatment/procedure(s) were conducted as a shared visit with non-physician practitioner(s) and myself.  I personally evaluated the patient during the encounter.  57 year old male with history of diabetes and peripheral neuropathy presents the emergency department today with left-sided leg pain and swelling and redness.  Been going on for couple weeks.  Started as a little hole but is progressively worsened. Had a elevated temperature at home with some nausea but no vomiting.  Just general malaise as well. Man with significant swollen left lower extremity with erythema all the way up below his knee.  Multiple ulcers on that same extremity.  Warm to touch. I suspect patient likely has a pretty significant cellulitis.  With his history of diabetes and the extent of the cellulitis will admit to hospitalist.  Would also benefit from ultrasound to rule out DVT however I think it is more likely infectious.   Marily MemosMesner, Aloise Copus, MD 03/09/18 [phone]

## 2018-03-09 ENCOUNTER — Observation Stay (HOSPITAL_COMMUNITY): Payer: Medicaid - Out of State

## 2018-03-09 DIAGNOSIS — L03116 Cellulitis of left lower limb: Secondary | ICD-10-CM | POA: Diagnosis not present

## 2018-03-09 DIAGNOSIS — E1151 Type 2 diabetes mellitus with diabetic peripheral angiopathy without gangrene: Secondary | ICD-10-CM | POA: Diagnosis present

## 2018-03-09 DIAGNOSIS — Z23 Encounter for immunization: Secondary | ICD-10-CM | POA: Diagnosis not present

## 2018-03-09 DIAGNOSIS — Z7984 Long term (current) use of oral hypoglycemic drugs: Secondary | ICD-10-CM | POA: Diagnosis not present

## 2018-03-09 DIAGNOSIS — L409 Psoriasis, unspecified: Secondary | ICD-10-CM | POA: Diagnosis present

## 2018-03-09 DIAGNOSIS — E1165 Type 2 diabetes mellitus with hyperglycemia: Secondary | ICD-10-CM | POA: Diagnosis present

## 2018-03-09 DIAGNOSIS — W57XXXA Bitten or stung by nonvenomous insect and other nonvenomous arthropods, initial encounter: Secondary | ICD-10-CM | POA: Diagnosis present

## 2018-03-09 DIAGNOSIS — S80862A Insect bite (nonvenomous), left lower leg, initial encounter: Secondary | ICD-10-CM | POA: Diagnosis present

## 2018-03-09 DIAGNOSIS — Z833 Family history of diabetes mellitus: Secondary | ICD-10-CM | POA: Diagnosis not present

## 2018-03-09 DIAGNOSIS — Z8249 Family history of ischemic heart disease and other diseases of the circulatory system: Secondary | ICD-10-CM | POA: Diagnosis not present

## 2018-03-09 DIAGNOSIS — Z720 Tobacco use: Secondary | ICD-10-CM | POA: Diagnosis not present

## 2018-03-09 DIAGNOSIS — L02612 Cutaneous abscess of left foot: Secondary | ICD-10-CM | POA: Diagnosis not present

## 2018-03-09 DIAGNOSIS — Z79899 Other long term (current) drug therapy: Secondary | ICD-10-CM | POA: Diagnosis not present

## 2018-03-09 DIAGNOSIS — Z791 Long term (current) use of non-steroidal anti-inflammatories (NSAID): Secondary | ICD-10-CM | POA: Diagnosis not present

## 2018-03-09 DIAGNOSIS — Z9114 Patient's other noncompliance with medication regimen: Secondary | ICD-10-CM | POA: Diagnosis not present

## 2018-03-09 DIAGNOSIS — L02416 Cutaneous abscess of left lower limb: Secondary | ICD-10-CM | POA: Diagnosis not present

## 2018-03-09 DIAGNOSIS — I10 Essential (primary) hypertension: Secondary | ICD-10-CM | POA: Diagnosis present

## 2018-03-09 DIAGNOSIS — E1142 Type 2 diabetes mellitus with diabetic polyneuropathy: Secondary | ICD-10-CM | POA: Diagnosis present

## 2018-03-09 DIAGNOSIS — M1990 Unspecified osteoarthritis, unspecified site: Secondary | ICD-10-CM | POA: Diagnosis present

## 2018-03-09 LAB — BASIC METABOLIC PANEL
Anion gap: 7 (ref 5–15)
BUN: 17 mg/dL (ref 6–20)
CHLORIDE: 102 mmol/L (ref 98–111)
CO2: 28 mmol/L (ref 22–32)
CREATININE: 0.88 mg/dL (ref 0.61–1.24)
Calcium: 8.6 mg/dL — ABNORMAL LOW (ref 8.9–10.3)
GFR calc Af Amer: >60 mL/min (ref >60)
GFR calc non Af Amer: >60 mL/min (ref >60)
GLUCOSE: 262 mg/dL — AB (ref 70–99)
POTASSIUM: 3.7 mmol/L (ref 3.5–5.1)
Sodium: 137 mmol/L (ref 135–145)

## 2018-03-09 LAB — LIPID PANEL
CHOL/HDL RATIO: 6.1 ratio
CHOLESTEROL: 159 mg/dL (ref 0–200)
HDL: 26 mg/dL — ABNORMAL LOW (ref >40)
LDL Cholesterol: 104 mg/dL — ABNORMAL HIGH (ref 0–99)
Triglycerides: 147 mg/dL (ref <150)
VLDL: 29 mg/dL (ref 0–40)

## 2018-03-09 LAB — GLUCOSE, CAPILLARY
GLUCOSE-CAPILLARY: 275 mg/dL — AB (ref 70–99)
GLUCOSE-CAPILLARY: 202 mg/dL — AB (ref 70–99)
GLUCOSE-CAPILLARY: 241 mg/dL — AB (ref 70–99)
Glucose-Capillary: 241 mg/dL — ABNORMAL HIGH (ref 70–99)

## 2018-03-09 LAB — HEMOGLOBIN A1C
Hgb A1c MFr Bld: 8.5 % — ABNORMAL HIGH (ref 4.8–5.6)
MEAN PLASMA GLUCOSE: 197.25 mg/dL

## 2018-03-09 MED ORDER — TRAZODONE HCL 50 MG PO TABS
150.0000 mg | ORAL_TABLET | Freq: Every day | ORAL | Status: DC
  Administered 2018-03-09 – 2018-03-10 (×2): 150 mg via ORAL
  Filled 2018-03-09 (×2): qty 3

## 2018-03-09 MED ORDER — INSULIN GLARGINE 100 UNIT/ML ~~LOC~~ SOLN
15.0000 [IU] | Freq: Every day | SUBCUTANEOUS | Status: DC
  Administered 2018-03-09: 15 [IU] via SUBCUTANEOUS
  Filled 2018-03-09 (×3): qty 0.15

## 2018-03-09 MED ORDER — INFLUENZA VAC SPLIT QUAD 0.5 ML IM SUSY
0.5000 mL | PREFILLED_SYRINGE | INTRAMUSCULAR | Status: AC
  Administered 2018-03-10: 0.5 mL via INTRAMUSCULAR
  Filled 2018-03-09: qty 0.5

## 2018-03-09 MED ORDER — HYDRALAZINE HCL 20 MG/ML IJ SOLN: 10.0000 mg | Freq: Four times a day (QID) | INTRAMUSCULAR | Status: DC | PRN

## 2018-03-09 MED ORDER — PIPERACILLIN-TAZOBACTAM 3.375 G IVPB
3.3750 g | Freq: Three times a day (TID) | INTRAVENOUS | Status: DC
  Administered 2018-03-09 – 2018-03-11 (×7): 3.375 g via INTRAVENOUS
  Filled 2018-03-09 (×8): qty 50

## 2018-03-09 NOTE — Progress Notes (Signed)
Pt states he changed his mind and would like to receive the flu vaccine. Order placed.

## 2018-03-09 NOTE — Progress Notes (Signed)
Inpatient Diabetes Program Recommendations  AACE/ADA: New Consensus Statement on Inpatient Glycemic Control (2015)  Target Ranges:  Prepandial:   less than 140 mg/dL      Peak postprandial:   less than 180 mg/dL (1-2 hours)      Critically ill patients:  140 - 180 mg/dL   Lab Results  Component Value Date   GLUCAP 202 (H) 03/09/2018    Review of Glycemic ControlResults for Jason Graham, Jamille (MRN [id number]) as of 03/09/2018 11:47  Ref. Range 03/08/2018 23:23 03/09/2018 07:34 03/09/2018 11:43  Glucose-Capillary Latest Ref Range: 70 - 99 mg/dL 782224 (H) 956275 (H) 213202 (H)    Diabetes history: Type 2 DM  Outpatient Diabetes medications: Metformin 1000 mg bid Current orders for Inpatient glycemic control:  Novolog sensitive tid with meals and HS  Inpatient Diabetes Program Recommendations:   A1C pending.  Please consider adding Lantus 15 units daily while in the hospital.   Thanks,  Beryl MeagerJenny Tynan Boesel, RN, BC-ADM Inpatient Diabetes Coordinator Pager [phone] (8a-5p)
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
## 2018-03-09 NOTE — Progress Notes (Signed)
Patient Demographics:    Jason Graham, is a 58 y.o. male, DOB - 1961-05-22, ZOX:[id number]  Admit date - 03/08/2018   Admitting Physician Synetta Fail, MD  Outpatient Primary MD for the patient is [name], MD  LOS - 0   Chief Complaint  Patient presents with  . Insect Bite        Subjective:    Jason Graham today has no fevers, no emesis,  No chest pain, sister at bedside, questions answered , no diarrhea  Assessment  & Plan :    Principal Problem:   Cellulitis and abscess of left leg Active Problems:   DM (diabetes mellitus) (HCC)   Foot abscess, left   Essential hypertension uncontrolled    1) left lower extremity cellulitis----wound care consult appreciated, discussed with Dr. Lovell Sheehan the general surgeon, no indication for further I&D at this time, continue Vanco, add Zosyn given that patient is diabetic risk for polymicrobial infection, culture results pending  2)DM2-hemoglobin A1c is 8.5, will start Lantus insulin 15 units nightly,, Use Novolog/Humalog Sliding scale insulin with Accu-Cheks/Fingersticks as ordered   3)HTN-continue lisinopril 5 mg daily   may use IV Hydralazine 10 mg  Every 4 hours Prn for systolic blood pressure over 160 mmhg  4)Lt leg wound care-continue with normal saline apply wet-to-dry dressing twice a day then apply Xeroform  5)Tobacco Abuse--arterial Dopplers with ABIs suggest mild arterial disease on the right and no significant arterial disease on the left, smoking cessation advised  Disposition/Need for in-Hospital Stay- patient unable to be discharged at this time due to significant cellulitis of the left lower extremity requiring IV antibiotics pending cultures  Code Status : Full code   Disposition Plan  : Home  Consults  :  Curbside consultation with general surgeon Dr. Lovell Sheehan   DVT Prophylaxis  :  Lovenox    Lab Results  Component  Value Date   PLT 380 03/08/2018    Inpatient Medications  Scheduled Meds: . enoxaparin (LOVENOX) injection  40 mg Subcutaneous Q24H  . [START ON 03/10/2018] Influenza vac split quadrivalent PF  0.5 mL Intramuscular Tomorrow-1000  . insulin aspart  0-5 Units Subcutaneous QHS  . insulin aspart  0-9 Units Subcutaneous TID WC  . lisinopril  5 mg Oral Daily   Continuous Infusions: . sodium chloride 500 mL (03/09/18 1119)  . piperacillin-tazobactam (ZOSYN)  IV 3.375 g (03/09/18 1121)  . vancomycin 1,000 mg (03/09/18 1748)   PRN Meds:.sodium chloride, acetaminophen **OR** acetaminophen, ondansetron **OR** ondansetron (ZOFRAN) IV, senna-docusate, traMADol    Anti-infectives (From admission, onward)   Start     Dose/Rate Route Frequency Ordered Stop   03/09/18 1100  piperacillin-tazobactam (ZOSYN) IVPB 3.375 g     3.375 g 12.5 mL/hr over 240 Minutes Intravenous Every 8 hours 03/09/18 1017     03/09/18 0600  vancomycin (VANCOCIN) IVPB 1000 mg/200 mL premix     1,000 mg 200 mL/hr over 60 Minutes Intravenous Every 8 hours 03/08/18 2207     03/09/18 0500  vancomycin (VANCOCIN) IVPB 1000 mg/200 mL premix  Status:  Discontinued     1,000 mg 200 mL/hr over 60 Minutes Intravenous  Once 03/08/18 2308 03/08/18 2315   03/08/18 2045  vancomycin (VANCOCIN) IVPB 1000 mg/200 mL premix  1,000 mg 200 mL/hr over 60 Minutes Intravenous  Once 03/08/18 2032 03/08/18 2219   03/08/18 2015  piperacillin-tazobactam (ZOSYN) IVPB 3.375 g     3.375 g 100 mL/hr over 30 Minutes Intravenous  Once 03/08/18 2011 03/08/18 2106        Objective:   Vitals:   03/08/18 2116 03/08/18 2322 03/09/18 0600 03/09/18 1316  BP: (!) 178/77 (!) 176/74 (!) 161/95 (!) 146/78  Pulse: 76 80 82 74  Resp: 18   18  Temp:  98.6 F (37 C) 98.4 F (36.9 C) 98.7 F (37.1 C)  TempSrc:  Oral Oral Oral  SpO2: 96% 97% 98% 99%  Weight:      Height:        Wt Readings from Last 3 Encounters:  03/08/18 81.6 kg  04/17/13 90.1 kg   04/08/13 97.4 kg     Intake/Output Summary (Last 24 hours) at 03/09/2018 1752 Last data filed at 03/09/2018 1710 Gross per 24 hour  Intake 773.52 ml  Output -  Net 773.52 ml    Physical Exam Patient is examined daily including today on 03/09/18 , exams remain the same as of yesterday except that has changed   Gen:- Awake Alert,  In no apparent distress  HEENT:- [place].AT, No sclera icterus Neck-Supple Neck,No JVD,.  Lungs-  CTAB , good air movement CV- S1, S2 normal Abd-  +ve B.Sounds, Abd Soft, No tenderness,    Psych-affect is appropriate, oriented x3 Neuro-no new focal deficits, no tremors Lt Leg/Skin- LLE lateral aspect: Proximal:1.2cm x 1.2cm x 0.2cm with red, moist wound bed (scant to small amount of serous exudate) Distal: 2cm x 2.5cm x 0.5cm with 25% red and 75% yellow slough. Moderate amount yellow exudate Wound bed: As described above Drainage (amount, consistency, odor) Small to moderate amount of yellow exudate Periwound:erythematous, edematous, warm  Data Review:   Micro Results Recent Results (from the past 240 hour(s))  Culture, blood (routine x 2)     Status: None (Preliminary result)   Collection Time: 03/08/18  7:42 PM  Result Value Ref Range Status   Specimen Description BLOOD LEFT ARM  Final   Special Requests   Final    BOTTLES DRAWN AEROBIC AND ANAEROBIC Blood Culture adequate volume   Culture   Final    NO GROWTH < 12 HOURS Performed at [facility], [address] [place], [place] [address]    Report Status PENDING  Incomplete  Culture, blood (routine x 2)     Status: None (Preliminary result)   Collection Time: 03/08/18  8:24 PM  Result Value Ref Range Status   Specimen Description BLOOD LEFT ARM  Final   Special Requests   Final    BOTTLES DRAWN AEROBIC AND ANAEROBIC Blood Culture adequate volume   Culture   Final    NO GROWTH < 12 HOURS Performed at [facility], [address] [place], [place] [address]    Report Status PENDING  Incomplete    Wound or Superficial Culture     Status: None (Preliminary result)   Collection Time: 03/08/18  9:11 PM  Result Value Ref Range Status   Specimen Description   Final    ANKLE Performed at [facility], [address], [place], [place] [address]    Special Requests   Final    NONE Performed at [facility], [address], [place], [place] [address]    Gram Stain   Final    MODERATE WBC PRESENT, PREDOMINANTLY PMN FEW GRAM POSITIVE  COCCI Performed at [facility], [address] [place], [place] [address]    Culture PENDING  Incomplete   Report Status PENDING  Incomplete  Wound or Superficial Culture     Status: None (Preliminary result)   Collection Time: 03/08/18  9:11 PM  Result Value Ref Range Status   Specimen Description   Final    ANKLE Performed at [facility], [address], [place], [place] [address]    Special Requests   Final    DISTAL OUTER AREA Performed at [facility], [address] [place], [place] [address]    Gram Stain   Final    MODERATE WBC PRESENT, PREDOMINANTLY PMN FEW GRAM POSITIVE COCCI Performed at [facility], [address], [place], [place] [address]    Culture PENDING  Incomplete   Report Status PENDING  Incomplete    Radiology Reports Dg Ankle Complete Left  Result Date: 03/08/2018 CLINICAL DATA:  Large wound/insect bite a distal left ankle. EXAM: LEFT ANKLE COMPLETE - 3+ VIEW COMPARISON:  No comparison studies available. FINDINGS: No evidence for an acute fracture. No subluxation or dislocation. Soft tissue swelling evident. No gas within the soft tissues. No gross bony destruction about the ankle to suggest osteomyelitis. IMPRESSION: Prominent soft tissue swelling without acute bony abnormality or gross bony destruction to suggest osteomyelitis. No evidence for gas within the soft tissues. Electronically Signed   By: Kennith Center M.D.   On: 03/08/2018 19:51   US Arterial Abi (screening Lower Extremity)  Result Date:  03/09/2018 CLINICAL DATA:  57 year old male with a history of foot wound. Cardiovascular risk factors include smoking, hypertension, diabetes EXAM: NONINVASIVE PHYSIOLOGIC VASCULAR STUDY OF BILATERAL LOWER EXTREMITIES TECHNIQUE: Evaluation of both lower extremities was performed at rest, including calculation of ankle-brachial indices, multiple segmental pressure evaluation, segmental Doppler and segmental pulse volume recording. COMPARISON:  None. FINDINGS: Right ABI:  0.88 Left ABI:  1.1 Right Lower Extremity: Segmental Doppler at the right ankle demonstrates monophasic waveforms. Left Lower Extremity: Segmental Doppler at the left ankle demonstrates triphasic posterior tibial artery and biphasic dorsalis pedis. 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 Segmental exam demonstrates evidence of at least tibial disease on the right. Left: Resting ABI within normal limits. Segmental exam demonstrates evidence of developing tibial disease of the dorsalis pedis. Signed, Yvone Neu. Reyne Dumas, RPVI [facility] Radiology Electronically Signed   By: Gilmer Mor D.O.   On: 03/09/2018 15:49     CBC Recent Labs  Lab 03/08/18 1942  WBC 12.1*  HGB 12.7*  HCT 37.7*  PLT 380  MCV 92.9  MCH 31.3  MCHC 33.7  RDW 12.4    Chemistries  Recent Labs  Lab 03/08/18 1942 03/09/18 0524  NA 139 137  K 4.1 3.7  CL 103 102  CO2 27 28  GLUCOSE 150* 262*  BUN 16 17  CREATININE 0.93 0.88  CALCIUM 9.1 8.6*   ------------------------------------------------------------------------------------------------------------------ Recent Labs    03/09/18 0524  CHOL 159  HDL 26*  LDLCALC 104*  TRIG 147  CHOLHDL 6.1    Lab Results  Component Value Date   HGBA1C 8.5 (H) 03/08/2018   ------------------------------------------------------------------------------------------------------------------ No results for input(s): TSH,  T4TOTAL, T3FREE, THYROIDAB in the last 72 hours.  Invalid input(s): FREET3 ------------------------------------------------------------------------------------------------------------------ No results for input(s): VITAMINB12, FOLATE, FERRITIN, TIBC, IRON, RETICCTPCT in the last 72 hours.  Coagulation profile No results for input(s): INR, PROTIME in the last  168 hours.  No results for input(s): DDIMER in the last 72 hours.  Cardiac Enzymes No results for input(s): CKMB, TROPONINI, MYOGLOBIN in the last 168 hours.  Invalid input(s): CK ------------------------------------------------------------------------------------------------------------------ No results found for: BNP   Shon Haleourage Altonio Schwertner M.D on 03/09/2018 at 5:52 PM  Pager---[phone] Go to [email or web address] - password [email or web address] for contact info  [facility] - Office  [phone]

## 2018-03-10 LAB — HIV ANTIBODY (ROUTINE TESTING W REFLEX): HIV Screen 4th Generation wRfx: NONREACTIVE

## 2018-03-10 LAB — GLUCOSE, CAPILLARY
GLUCOSE-CAPILLARY: 252 mg/dL — AB (ref 70–99)
GLUCOSE-CAPILLARY: 292 mg/dL — AB (ref 70–99)
GLUCOSE-CAPILLARY: 226 mg/dL — AB (ref 70–99)
Glucose-Capillary: 202 mg/dL — ABNORMAL HIGH (ref 70–99)

## 2018-03-10 MED ORDER — INSULIN GLARGINE 100 UNIT/ML ~~LOC~~ SOLN
18.0000 [IU] | Freq: Every day | SUBCUTANEOUS | Status: DC
  Administered 2018-03-10: 18 [IU] via SUBCUTANEOUS
  Filled 2018-03-10 (×2): qty 0.18

## 2018-03-10 NOTE — Progress Notes (Signed)
Changed patients dressing per order. Patient tolerated well.

## 2018-03-10 NOTE — Progress Notes (Signed)
Patient Demographics:    Jason Graham, is a 57 y.o. male, DOB - 1961/01/21, DGL:[id number]  Admit date - 03/08/2018   Admitting Physician Synetta Fail, MD  Outpatient Primary MD for the patient is [name], MD  LOS - 1   Chief Complaint  Patient presents with  . Insect Bite        Subjective:    [name] today has no fevers, no emesis, eating well, no New concerns  Assessment  & Plan :    Principal Problem:   Cellulitis and abscess of left leg Active Problems:   DM (diabetes mellitus) (HCC)   Foot abscess, left   Essential hypertension uncontrolled  Brief Summary:- 57 y.o. male with medical history significant of hypertension and diabetes and noncompliance with medications admitted on 03/08/18 with chills and leukocytosis and found to have lower extremity leg wound and cellulitis   Plan:- 1)Left Lower Extremity Cellulitis----wound care consult appreciated, discussed with Dr. Lovell Sheehan the general surgeon, no indication for further I&D at this time, continue Vanco, add Zosyn given that patient is diabetic risk for polymicrobial infection, preliminary wound culture from 03/08/18 with GPC (Staph Aureus)  final ID and sensitivity pending . Lt leg wound care-continue with normal saline apply wet-to-dry dressing twice a day then apply Xeroform  2)DM2-hemoglobin A1c is 8.5, hyperglycemia persist, increase Lantus insulin to 18 units nightly,, Use Novolog/Humalog Sliding scale insulin with Accu-Cheks/Fingersticks as ordered   3)HTN-stable, continue lisinopril 5 mg daily,  may use IV Hydralazine 10 mg  Every 4 hours Prn for systolic blood pressure over 160 mmhg  4)Tobacco Abuse/Mild PAD--arterial Dopplers with ABIs suggest mild arterial disease on the right and no significant arterial disease on the left, smoking cessation advised  Disposition/Need for in-Hospital Stay- patient unable to be  discharged at this time due to significant cellulitis of the left lower extremity requiring IV antibiotics pending final ID and sensitivity cultures  Code Status : Full code  Disposition Plan  : Home  Consults  :  Curbside consultation with general surgeon Dr. Lovell Sheehan   DVT Prophylaxis  :  Lovenox    Lab Results  Component Value Date   PLT 380 03/08/2018    Inpatient Medications  Scheduled Meds: . enoxaparin (LOVENOX) injection  40 mg Subcutaneous Q24H  . insulin aspart  0-5 Units Subcutaneous QHS  . insulin aspart  0-9 Units Subcutaneous TID WC  . insulin glargine  18 Units Subcutaneous QHS  . lisinopril  5 mg Oral Daily  . traZODone  150 mg Oral QHS   Continuous Infusions: . sodium chloride Stopped (03/10/18 0257)  . piperacillin-tazobactam (ZOSYN)  IV 3.375 g (03/10/18 1045)  . vancomycin 1,000 mg (03/10/18 1053)   PRN Meds:.sodium chloride, acetaminophen **OR** acetaminophen, hydrALAZINE, ondansetron **OR** ondansetron (ZOFRAN) IV, senna-docusate, traMADol    Anti-infectives (From admission, onward)   Start     Dose/Rate Route Frequency Ordered Stop   03/09/18 1100  piperacillin-tazobactam (ZOSYN) IVPB 3.375 g     3.375 g 12.5 mL/hr over 240 Minutes Intravenous Every 8 hours 03/09/18 1017     03/09/18 0600  vancomycin (VANCOCIN) IVPB 1000 mg/200 mL premix     1,000 mg 200 mL/hr over 60 Minutes Intravenous Every 8 hours 03/08/18 2207  03/09/18 0500  vancomycin (VANCOCIN) IVPB 1000 mg/200 mL premix  Status:  Discontinued     1,000 mg 200 mL/hr over 60 Minutes Intravenous  Once 03/08/18 2308 03/08/18 2315   03/08/18 2045  vancomycin (VANCOCIN) IVPB 1000 mg/200 mL premix     1,000 mg 200 mL/hr over 60 Minutes Intravenous  Once 03/08/18 2032 03/08/18 2219   03/08/18 2015  piperacillin-tazobactam (ZOSYN) IVPB 3.375 g     3.375 g 100 mL/hr over 30 Minutes Intravenous  Once 03/08/18 2011 03/08/18 2106        Objective:   Vitals:   03/09/18 1316 03/09/18 2045  03/10/18 0622 03/10/18 1415  BP: (!) 146/78 (!) 163/81 133/68 (!) 164/93  Pulse: 74 70 64 71  Resp: 18 18 18 18   Temp: 98.7 F (37.1 C) 98.6 F (37 C) 98.2 F (36.8 C) 98 F (36.7 C)  TempSrc: Oral Oral Oral Oral  SpO2: 99% 95% 97% 98%  Weight:      Height:        Wt Readings from Last 3 Encounters:  03/08/18 81.6 kg  04/17/13 90.1 kg  04/08/13 97.4 kg     Intake/Output Summary (Last 24 hours) at 03/10/2018 1638 Last data filed at 03/10/2018 1348 Gross per 24 hour  Intake 1805.52 ml  Output -  Net 1805.52 ml    Physical Exam Patient is examined daily including today on 03/10/18 , exams remain the same as of yesterday except that has changed   Gen:- Awake Alert,  In no apparent distress  HEENT:- [place].AT, No sclera icterus Neck-Supple Neck,No JVD,.  Lungs-  CTAB , good air movement CV- S1, S2 normal Abd-  +ve B.Sounds, Abd Soft, No tenderness,    Psych- affect is appropriate, oriented x3 Neuro-no new focal deficits, no tremors Lt Leg/Skin- LLE lateral aspect: Proximal:1.2cm x 1.2cm x 0.2cm with red, moist wound bed (scant to small amount of serous exudate) Distal: 2cm x 2.5cm x 0.5cm with 25% red and 75% yellow slough. Moderate amount yellow exudate Wound bed: As described above Drainage (amount, consistency, odor) Small to moderate amount of yellow exudate Periwound:erythematous, edematous, warm  Data Review:   Micro Results Recent Results (from the past 240 hour(s))  Culture, blood (routine x 2)     Status: None (Preliminary result)   Collection Time: 03/08/18  7:42 PM  Result Value Ref Range Status   Specimen Description BLOOD LEFT ARM  Final   Special Requests   Final    BOTTLES DRAWN AEROBIC AND ANAEROBIC Blood Culture adequate volume   Culture   Final    NO GROWTH 2 DAYS Performed at [facility], [address] [place], [place] [address]    Report Status PENDING  Incomplete  Culture, blood (routine x 2)     Status: None (Preliminary result)   Collection  Time: 03/08/18  8:24 PM  Result Value Ref Range Status   Specimen Description BLOOD LEFT ARM  Final   Special Requests   Final    BOTTLES DRAWN AEROBIC AND ANAEROBIC Blood Culture adequate volume   Culture   Final    NO GROWTH 2 DAYS Performed at [facility], [address], [place], [place] [address]    Report Status PENDING  Incomplete  Wound or Superficial Culture     Status: None (Preliminary result)   Collection Time: 03/08/18  9:11 PM  Result Value Ref Range Status   Specimen Description   Final    ANKLE Performed at [facility],  [address], [place], [place] [address]    Special Requests   Final    NONE Performed at [facility], [address] [place], [place] [address]    Gram Stain   Final    MODERATE WBC PRESENT, PREDOMINANTLY PMN FEW GRAM POSITIVE COCCI Performed at [facility], [address] [place], [place] [address]    Culture FEW STAPHYLOCOCCUS AUREUS  Final   Report Status PENDING  Incomplete  Wound or Superficial Culture     Status: None (Preliminary result)   Collection Time: 03/08/18  9:11 PM  Result Value Ref Range Status   Specimen Description   Final    ANKLE Performed at [facility], [address] [place], [place] [address]    Special Requests   Final    DISTAL OUTER AREA Performed at [facility], [address] [place], [place] [address]    Gram Stain   Final    MODERATE WBC PRESENT, PREDOMINANTLY PMN FEW GRAM POSITIVE COCCI Performed at [facility], [address] [place], [place] [address]    Culture FEW STAPHYLOCOCCUS AUREUS  Final   Report Status PENDING  Incomplete    Radiology Reports Dg Ankle Complete Left  Result Date: 03/08/2018 CLINICAL DATA:  Large wound/insect bite a distal left ankle. EXAM: LEFT ANKLE COMPLETE - 3+ VIEW COMPARISON:  No comparison studies available. FINDINGS: No evidence for an acute fracture. No subluxation or dislocation. Soft tissue swelling evident. No gas within the soft tissues. No  gross bony destruction about the ankle to suggest osteomyelitis. IMPRESSION: Prominent soft tissue swelling without acute bony abnormality or gross bony destruction to suggest osteomyelitis. No evidence for gas within the soft tissues. Electronically Signed   By: Kennith Center M.D.   On: 03/08/2018 19:51   US Arterial Abi (screening Lower Extremity)  Result Date: 03/09/2018 CLINICAL DATA:  57 year old male with a history of foot wound. Cardiovascular risk factors include smoking, hypertension, diabetes EXAM: NONINVASIVE PHYSIOLOGIC VASCULAR STUDY OF BILATERAL LOWER EXTREMITIES TECHNIQUE: Evaluation of both lower extremities was performed at rest, including calculation of ankle-brachial indices, multiple segmental pressure evaluation, segmental Doppler and segmental pulse volume recording. COMPARISON:  None. FINDINGS: Right ABI:  0.88 Left ABI:  1.1 Right Lower Extremity: Segmental Doppler at the right ankle demonstrates monophasic waveforms. Left Lower Extremity: Segmental Doppler at the left ankle demonstrates triphasic posterior tibial artery and biphasic dorsalis pedis. 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 Segmental exam demonstrates evidence of at least tibial disease on the right. Left: Resting ABI within normal limits. Segmental exam demonstrates evidence of developing tibial disease of the dorsalis pedis. Signed, Yvone Neu. Reyne Dumas, RPVI [facility] Radiology Electronically Signed   By: Gilmer Mor D.O.   On: 03/09/2018 15:49     CBC Recent Labs  Lab 03/08/18 1942  WBC 12.1*  HGB 12.7*  HCT 37.7*  PLT 380  MCV 92.9  MCH 31.3  MCHC 33.7  RDW 12.4    Chemistries  Recent Labs  Lab 03/08/18 1942 03/09/18 0524  NA 139 137  K 4.1 3.7  CL 103 102  CO2 27 28  GLUCOSE 150* 262*  BUN 16 17  CREATININE 0.93 0.88  CALCIUM 9.1 8.6*    ------------------------------------------------------------------------------------------------------------------ Recent Labs    03/09/18 0524  CHOL 159  HDL 26*  LDLCALC 104*  TRIG 147  CHOLHDL 6.1    Lab Results  Component Value Date  HGBA1C 8.5 (H) 03/08/2018   ------------------------------------------------------------------------------------------------------------------ No results for input(s): TSH, T4TOTAL, T3FREE, THYROIDAB in the last 72 hours.  Invalid input(s): FREET3 ------------------------------------------------------------------------------------------------------------------ No results for input(s): VITAMINB12, FOLATE, FERRITIN, TIBC, IRON, RETICCTPCT in the last 72 hours.  Coagulation profile No results for input(s): INR, PROTIME in the last 168 hours.  No results for input(s): DDIMER in the last 72 hours.  Cardiac Enzymes No results for input(s): CKMB, TROPONINI, MYOGLOBIN in the last 168 hours.  Invalid input(s): CK ------------------------------------------------------------------------------------------------------------------ No results found for: BNP   Shon Haleourage Caroll Cunnington M.D on 03/10/2018 at 4:38 PM  Pager---[phone] Go to [email or web address] - password [email or web address] for contact info  [facility] - Office  [phone]

## 2018-03-11 ENCOUNTER — Emergency Department (HOSPITAL_COMMUNITY)
Admission: EM | Admit: 2018-03-11 | Discharge: 2018-03-11 | Discharge status: Absent without leave | Payer: No Typology Code available for payment source

## 2018-03-11 LAB — BASIC METABOLIC PANEL
Anion gap: 10 (ref 5–15)
BUN: 12 mg/dL (ref 6–20)
CO2: 26 mmol/L (ref 22–32)
Calcium: 8.6 mg/dL — ABNORMAL LOW (ref 8.9–10.3)
Chloride: 102 mmol/L (ref 98–111)
Creatinine, Ser: 1.06 mg/dL (ref 0.61–1.24)
GFR calc non Af Amer: >60 mL/min (ref >60)
Glucose, Bld: 264 mg/dL — ABNORMAL HIGH (ref 70–99)
POTASSIUM: 4.4 mmol/L (ref 3.5–5.1)
SODIUM: 138 mmol/L (ref 135–145)

## 2018-03-11 LAB — CBC
HEMATOCRIT: 35.4 % — AB (ref 39.0–52.0)
HEMOGLOBIN: 11.5 g/dL — AB (ref 13.0–17.0)
MCH: 30.3 pg (ref 26.0–34.0)
MCHC: 32.5 g/dL (ref 30.0–36.0)
MCV: 93.4 fL (ref 78.0–100.0)
Platelets: 409 10*3/uL — ABNORMAL HIGH (ref 150–400)
RBC: 3.79 MIL/uL — AB (ref 4.22–5.81)
RDW: 12.3 % (ref 11.5–15.5)
WBC: 10.1 10*3/uL (ref 4.0–10.5)

## 2018-03-11 LAB — AEROBIC CULTURE W GRAM STAIN (SUPERFICIAL SPECIMEN)

## 2018-03-11 LAB — AEROBIC CULTURE  (SUPERFICIAL SPECIMEN)

## 2018-03-11 LAB — VANCOMYCIN, TROUGH: VANCOMYCIN TR: 20 ug/mL (ref 15–20)

## 2018-03-11 LAB — GLUCOSE, CAPILLARY
GLUCOSE-CAPILLARY: 226 mg/dL — AB (ref 70–99)
Glucose-Capillary: 329 mg/dL — ABNORMAL HIGH (ref 70–99)
Glucose-Capillary: 283 mg/dL — ABNORMAL HIGH (ref 70–99)

## 2018-03-11 MED ORDER — INSULIN GLARGINE 100 UNIT/ML SOLOSTAR PEN: 20.0000 [IU] | PEN_INJECTOR | Freq: Every day | SUBCUTANEOUS | 3 refills | Status: DC

## 2018-03-11 MED ORDER — CEPHALEXIN 500 MG PO CAPS
500.0000 mg | ORAL_CAPSULE | Freq: Once | ORAL | Status: AC
  Administered 2018-03-11: 500 mg via ORAL
  Filled 2018-03-11: qty 1

## 2018-03-11 MED ORDER — ALCOHOL WIPES 70 % PADS: 1.0000 "application " | MEDICATED_PAD | Freq: Three times a day (TID) | 0 refills | Status: AC

## 2018-03-11 MED ORDER — ACETAMINOPHEN 325 MG PO TABS: 650.0000 mg | ORAL_TABLET | Freq: Four times a day (QID) | ORAL | 0 refills | Status: DC | PRN

## 2018-03-11 MED ORDER — XEROFORM PETROLAT GAUZE 5"X9" EX MISC: 1.0000 | Freq: Two times a day (BID) | CUTANEOUS | 0 refills | Status: DC

## 2018-03-11 MED ORDER — SODIUM CHLORIDE 0.9 % EX SOLN: 5.0000 mL | Freq: Two times a day (BID) | CUTANEOUS | 0 refills | Status: DC

## 2018-03-11 MED ORDER — CONFORMING ROLLED GAUZE MISC: 1.0000 | Freq: Two times a day (BID) | 0 refills | Status: DC

## 2018-03-11 MED ORDER — VANCOMYCIN HCL IN DEXTROSE 1-5 GM/200ML-% IV SOLN: 1000.0000 mg | Freq: Two times a day (BID) | INTRAVENOUS | Status: DC

## 2018-03-11 MED ORDER — LISINOPRIL 10 MG PO TABS: 10.0000 mg | ORAL_TABLET | Freq: Every day | ORAL | 3 refills | Status: DC

## 2018-03-11 MED ORDER — GAUZE PADS 4"X4" PADS: 2.0000 | MEDICATED_PAD | Freq: Two times a day (BID) | 0 refills | Status: DC

## 2018-03-11 MED ORDER — METFORMIN HCL 1000 MG PO TABS: 1000.0000 mg | ORAL_TABLET | Freq: Two times a day (BID) | ORAL | 4 refills | Status: DC

## 2018-03-11 MED ORDER — CEPHALEXIN 500 MG PO CAPS
500.0000 mg | ORAL_CAPSULE | Freq: Three times a day (TID) | ORAL | 0 refills | Status: AC
Start: 2018-03-11 — End: ?

## 2018-03-11 MED ORDER — BLOOD GLUCOSE METER KIT: PACK | 3 refills | Status: AC

## 2018-03-11 MED ORDER — LACTINEX PO CHEW: 1.0000 | CHEWABLE_TABLET | Freq: Three times a day (TID) | ORAL | 0 refills | Status: AC

## 2018-03-11 NOTE — Discharge Instructions (Signed)
1)Take insulin and metformin for diabetes as prescribed 2)Take Keflex/cefazolin antibiotic 500 mg 3 times a day for up to 10 days for leg infection as advised 3)Lt Leg Wound care-cleansed with saline solution (Normal saline), apply wet-to-dry dressing twice a day then apply Xeroform over wound and use gauze and wraps, otherwise keep wound dry and clean 4) follow-up with a healthcare provider for wound recheck within 5 to 7 days

## 2018-03-11 NOTE — Progress Notes (Signed)
Pharmacy Antibiotic Note Today is day #4 of vancomycin and Zosyn therapy for this 57 yo male with LLE cellulitis.  Wound cultures grew S. .aureus, with C&S to follow. Blood cultures have shown no growth to date.  Patient is currently afebrile and WBC count is WNL.  Vancomycin trough of 20mg /mL at 1018 today is at the upper limit of desired goal range.   Plan: Change vancomycin to 1g IV q12h, with next dose at 1800 on 03/11/18 Pharmacy will continue to monitor renal function, vancomycin troughs, cultures and patient progress.   Height: 5\' 8"  (172.7 cm) Weight: 180 lb (81.6 kg) IBW/kg (Calculated) : 68.4  Temp (24hrs), Avg:98.4 F (36.9 C), Min:98 F (36.7 C), Max:98.7 F (37.1 C)  Recent Labs  Lab 03/08/18 1942 03/08/18 2046 03/09/18 0524 03/11/18 0551 03/11/18 1018  WBC 12.1*  --   --  10.1  --   CREATININE 0.93  --  0.88 1.06  --   LATICACIDVEN  --  1.85  --   --   --   VANCOTROUGH  --   --   --   --  20    Estimated Creatinine Clearance: 72.6 mL/min (by C-G formula based on SCr of 1.06 mg/dL).    No Known Allergies  Antimicrobials this admission: Vancomycin 9/20>>   Zosyn 9/20 >>   Dose adjustments this admission: 03/11/18 Vancomycin adjusted from 1g IV q8h to 1g IV q12h for Vancomycin trough=7620mcg/mL  Microbiology results:  9/19 Bcx2 : ng x3 days  9/19 ankle wound: Gs= few S. aureus 9/19 ankle (distal) wound: few S. aereus   Thank you for allowing pharmacy to participate in this patient's care.  Tama Highamara Talley Kreiser, Pharm. D. Clinical Pharmacist 03/11/2018 11:50 AM

## 2018-03-11 NOTE — Discharge Summary (Signed)
Jason Graham, is a 57 y.o. male  DOB [birth year]  MRN [id number].  Admission date:  03/08/2018  Admitting Physician  Shelbie Proctor, MD  Discharge Date:  03/11/2018   Primary MD  Marcelle Smiling, MD  Recommendations for primary care physician for things to follow:   1)Take insulin and metformin for diabetes as prescribed 2)Take Keflex/cefazolin antibiotic 500 mg 3 times a day for up to 10 days for leg infection as advised 3)Lt Leg Wound care-cleansed with saline solution (Normal saline), apply wet-to-dry dressing twice a day then apply Xeroform over wound and use gauze and wraps, otherwise keep wound dry and clean 4)Follow-up with a healthcare provider for wound recheck within 5 to 7 days  Admission Diagnosis  Cellulitis and abscess of left lower extremity [L03.116, L02.416]   Discharge Diagnosis  Cellulitis and abscess of left lower extremity [L03.116, L02.416]   Principal Problem:   Cellulitis and abscess of left leg Active Problems:   DM (diabetes mellitus) ([place])   Foot abscess, left   Essential hypertension uncontrolled      Past Medical History:  Diagnosis Date  . Arthritis   . Diabetes mellitus without complication ([place])   . Hypertension     Past Surgical History:  Procedure Laterality Date  . LAPAROTOMY N/A 04/07/2013   Procedure: EXPLORATORY LAPAROTOMY, REPAIR OF SMALL BOWEL ENTEROTOMY, REPAIR OF FASCIAL DEFECT;  Surgeon: Harl Bowie, MD;  Location: [facility];  Service: General;  Laterality: N/A;      HPI  from the history and physical done on the day of admission:    Chief Complaint: leg wound  HPI: Jason Graham is a 57 y.o. male with medical history significant of hypertension and diabetes noncompliant with medications presents with lower extremity leg wound and cellulitis.  Patient has had this wound in his lower extremity for about a week and a half.  He is been treating  at home with Neosporin and peroxide.  He initially started with a nickel size hole on the lateral aspect of his lower left leg.  About 2 days ago and another area appeared.  The erythema has progressed up his leg.  He also admits to some chills.  He states he has neuropathy and does not have very much feeling in his lower extremity.  Patient afebrile here.  He has a slight white count of 12,000.  Lactic acid is normal.  Lower extremity x-ray shows edema but no signs of osteomyelitis.  Patient has not had insurance and therefore has not seen a primary care provider in many years. He has occasionally brought his metformin off the street or got some  from a friend however he has been out for quite a while.  he however recently got approved for medicare and can now gets meds and care   ED Course: ED physician assistant I&D the area and gave him Zosyn and vancomycin   Hospital Course:     Brief Summary:- 57 y.o.malewith medical history significant ofhypertension and diabetes and noncompliance with medications admitted on  03/08/18 with chills and leukocytosis and found to have lower extremity leg wound and cellulitis   Plan:- 1)Left Lower Extremity Cellulitis----wound care consult appreciated, discussed with Dr. Arnoldo Morale the general surgeon, no indication for further I&D at this time, treated with IV Vanco and Zosyn given that patient is diabetic at risk for polymicrobial infection,  leukocytosis has resolved, patient remains afebrile.  wound culture from 03/08/18 with MSSA and group B strep, phone consultation with Dr. Linus Salmons from infectious disease prior to discharge on 03/11/2018 okay to discharge home with Keflex 500 mg 3 times daily. . Lt leg wound care-continue with normal saline apply wet-to-dry dressing twice a day then apply Xeroform, follow-up with a care provider for wound recheck in 5 to 7 days sooner if any concerns  2)DM2-hemoglobin A1c is 8.5,  poor control, discharged home on Lantus  insulin20 units nightly,, as well as metformin 1000 mg  twice daily, diabetic diet and lifestyle discussed   3)HTN-stable, continue lisinopril 10 mg daily,    4)Tobacco Abuse/Mild PAD--arterial Dopplers with ABIs suggest mild arterial disease on the right and no significant arterial disease on the left, smoking cessation advised   Code Status : Full code  Disposition Plan  : Home  Discharge Condition: stable  Follow UP--healthcare provider in 5 to 7 days for the left leg wound recheck   Consults obtained -phone consultation with Dr. Linus Salmons from infectious disease prior to discharge on 03/11/2018, previously had curbside consultation with Dr. Arnoldo Morale during hospital stay  Diet and Activity recommendation:  As advised  Discharge Instructions    Discharge Instructions    Call MD for:  difficulty breathing, headache or visual disturbances   Complete by:  As directed    Call MD for:  persistant dizziness or light-headedness   Complete by:  As directed    Call MD for:  persistant nausea and vomiting   Complete by:  As directed    Call MD for:  redness, tenderness, or signs of infection (pain, swelling, redness, odor or green/yellow discharge around incision site)   Complete by:  As directed    Call MD for:  severe uncontrolled pain   Complete by:  As directed    Call MD for:  temperature >100.4   Complete by:  As directed    Diet - low sodium heart healthy   Complete by:  As directed    Diet Carb Modified   Complete by:  As directed    Discharge instructions   Complete by:  As directed    1)Take insulin and metformin for diabetes as prescribed 2)Take Keflex/cefazolin antibiotic 500 mg 3 times a day for up to 10 days for leg infection as advised 3)Lt Leg Wound care-cleansed with saline solution (Normal saline), apply wet-to-dry dressing twice a day then apply Xeroform over wound and use gauze and wraps, otherwise keep wound dry and clean 4) follow-up with a healthcare provider  for wound recheck within 5 to 7 days   Increase activity slowly   Complete by:  As directed        Discharge Medications     Allergies as of 03/11/2018   No Known Allergies     Medication List    TAKE these medications   acetaminophen 325 MG tablet Commonly known as:  TYLENOL Take 2 tablets (650 mg total) by mouth every 6 (six) hours as needed for mild pain (or Fever >/= 101).   Alcohol Wipes 70 % Pads 1 application by Does not apply route  4 (four) times daily -  before meals and at bedtime. For blood sugar check   blood glucose meter kit and supplies Relion Prime or Dispense other brand based on patient and insurance preference. Use up to four times daily as directed. (FOR ICD-9 250.00, 250.01).   cephALEXin 500 MG capsule Commonly known as:  KEFLEX Take 1 capsule (500 mg total) by mouth 3 (three) times daily.   Conforming Rolled Gauze Misc 1 application by Does not apply route 2 (two) times daily. For left leg wound dressing change   GAUZE PADS 4"X4" 4"X4" Pads Apply 2 Doses topically 2 (two) times daily. For leg wound dressing change   Insulin Glargine 100 UNIT/ML Solostar Pen Commonly known as:  LANTUS Inject 20 Units into the skin at bedtime.   lactobacillus acidophilus & bulgar chewable tablet Chew 1 tablet by mouth 3 (three) times daily with meals.   lisinopril 10 MG tablet Commonly known as:  PRINIVIL,ZESTRIL Take 1 tablet (10 mg total) by mouth daily.   metFORMIN 1000 MG tablet Commonly known as:  GLUCOPHAGE Take 1 tablet (1,000 mg total) by mouth 2 (two) times daily with a meal.   OVER THE COUNTER MEDICATION Apply 1 application topically daily. "blue star" ointment to psoriasis   SODIUM CHLORIDE (EXTERNAL) 0.9 % Soln Apply 5 mLs topically 2 (two) times daily. For leg wound dressing change   XEROFORM PETROLAT GAUZE 5"X9" Misc Apply 1 Package topically 2 (two) times daily. For leg wound dressing change       Major procedures and Radiology Reports  - PLEASE review detailed and final reports for all details, in brief -    Dg Ankle Complete Left  Result Date: 03/08/2018 CLINICAL DATA:  Large wound/insect bite a distal left ankle. EXAM: LEFT ANKLE COMPLETE - 3+ VIEW COMPARISON:  No comparison studies available. FINDINGS: No evidence for an acute fracture. No subluxation or dislocation. Soft tissue swelling evident. No gas within the soft tissues. No gross bony destruction about the ankle to suggest osteomyelitis. IMPRESSION: Prominent soft tissue swelling without acute bony abnormality or gross bony destruction to suggest osteomyelitis. No evidence for gas within the soft tissues. Electronically Signed   By: Misty Stanley M.D.   On: 03/08/2018 19:51   US Arterial Abi (screening Lower Extremity)  Result Date: 03/09/2018 CLINICAL DATA:  57 year old male with a history of foot wound. Cardiovascular risk factors include smoking, hypertension, diabetes EXAM: NONINVASIVE PHYSIOLOGIC VASCULAR STUDY OF BILATERAL LOWER EXTREMITIES TECHNIQUE: Evaluation of both lower extremities was performed at rest, including calculation of ankle-brachial indices, multiple segmental pressure evaluation, segmental Doppler and segmental pulse volume recording. COMPARISON:  None. FINDINGS: Right ABI:  0.88 Left ABI:  1.1 Right Lower Extremity: Segmental Doppler at the right ankle demonstrates monophasic waveforms. Left Lower Extremity: Segmental Doppler at the left ankle demonstrates triphasic posterior tibial artery and biphasic dorsalis pedis. 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 Segmental exam demonstrates evidence of at least tibial disease on the right. Left: Resting ABI within normal limits. Segmental exam demonstrates evidence of developing tibial disease of the dorsalis pedis. Signed, Dulcy Fanny. Dellia Nims, RPVI [facility] Electronically Signed   By: Corrie Mckusick D.O.   On:  03/09/2018 15:49    Micro Results    Recent Results (from the past 240 hour(s))  Culture, blood (routine x 2)     Status: None (Preliminary result)   Collection Time: 03/08/18  7:42 PM  Result  Value Ref Range Status   Specimen Description BLOOD LEFT ARM  Final   Special Requests   Final    BOTTLES DRAWN AEROBIC AND ANAEROBIC Blood Culture adequate volume   Culture   Final    NO GROWTH 3 DAYS Performed at [facility], [address], [place], [place] [address]    Report Status PENDING  Incomplete  Culture, blood (routine x 2)     Status: None (Preliminary result)   Collection Time: 03/08/18  8:24 PM  Result Value Ref Range Status   Specimen Description BLOOD LEFT ARM  Final   Special Requests   Final    BOTTLES DRAWN AEROBIC AND ANAEROBIC Blood Culture adequate volume   Culture   Final    NO GROWTH 3 DAYS Performed at [facility], [address], [place], [place] [address]    Report Status PENDING  Incomplete  Wound or Superficial Culture     Status: None   Collection Time: 03/08/18  9:11 PM  Result Value Ref Range Status   Specimen Description   Final    ANKLE Performed at [facility], [address] [place], [place] [address]    Special Requests   Final    NONE Performed at [facility], [address] [place], [place] [address]    Gram Stain   Final    MODERATE WBC PRESENT, PREDOMINANTLY PMN FEW GRAM POSITIVE COCCI    Culture   Final    FEW STAPHYLOCOCCUS AUREUS FEW GROUP B STREP(S.AGALACTIAE)ISOLATED TESTING AGAINST S. AGALACTIAE NOT ROUTINELY PERFORMED DUE TO PREDICTABILITY OF AMP/PEN/VAN SUSCEPTIBILITY. Performed at [facility], [place] [address], [place], [place] [address]    Report Status 03/11/2018 FINAL  Final   Organism ID, Bacteria STAPHYLOCOCCUS AUREUS  Final      Susceptibility   Staphylococcus aureus - MIC*    CIPROFLOXACIN <=0.5 SENSITIVE Sensitive     ERYTHROMYCIN <=0.25 SENSITIVE Sensitive     GENTAMICIN <=0.5 SENSITIVE Sensitive      OXACILLIN 0.5 SENSITIVE Sensitive     TETRACYCLINE <=1 SENSITIVE Sensitive     VANCOMYCIN <=0.5 SENSITIVE Sensitive     TRIMETH/SULFA <=10 SENSITIVE Sensitive     CLINDAMYCIN <=0.25 SENSITIVE Sensitive     RIFAMPIN <=0.5 SENSITIVE Sensitive     Inducible Clindamycin NEGATIVE Sensitive     * FEW STAPHYLOCOCCUS AUREUS  Wound or Superficial Culture     Status: None   Collection Time: 03/08/18  9:11 PM  Result Value Ref Range Status   Specimen Description   Final    ANKLE Performed at [facility], [address], [place], [place] [address]    Special Requests   Final    DISTAL OUTER AREA Performed at [facility], [address] [place], [place] [address]    Gram Stain   Final    MODERATE WBC PRESENT, PREDOMINANTLY PMN FEW GRAM POSITIVE COCCI    Culture   Final    FEW STAPHYLOCOCCUS AUREUS FEW GROUP B STREP(S.AGALACTIAE)ISOLATED TESTING AGAINST S. AGALACTIAE NOT ROUTINELY PERFORMED DUE TO PREDICTABILITY OF AMP/PEN/VAN SUSCEPTIBILITY. Performed at [facility], [place] [address], [place], [place] [address]    Report Status 03/11/2018 FINAL  Final   Organism ID, Bacteria STAPHYLOCOCCUS AUREUS  Final      Susceptibility   Staphylococcus aureus - MIC*    CIPROFLOXACIN <=0.5 SENSITIVE Sensitive     ERYTHROMYCIN <=0.25 SENSITIVE Sensitive     GENTAMICIN <=0.5 SENSITIVE Sensitive     OXACILLIN 0.5 SENSITIVE Sensitive  TETRACYCLINE <=1 SENSITIVE Sensitive     VANCOMYCIN 1 SENSITIVE Sensitive     TRIMETH/SULFA <=10 SENSITIVE Sensitive     CLINDAMYCIN <=0.25 SENSITIVE Sensitive     RIFAMPIN <=0.5 SENSITIVE Sensitive     Inducible Clindamycin NEGATIVE Sensitive     * FEW STAPHYLOCOCCUS AUREUS       Today   Subjective    Jason Graham today has no new complaints, no fevers, no chills, eating and drinking well no vomiting or diarrhea, left leg discomfort is much better          Patient has been seen and examined prior to discharge   Objective   Blood pressure (!)  125/56, pulse 70, temperature 98.7 F (37.1 C), temperature source Oral, resp. rate 15, height _0  (1.727 m), weight 81.6 kg, SpO2 95 %.   Intake/Output Summary (Last 24 hours) at 03/11/2018 1411 Last data filed at 03/11/2018 1300 Gross per 24 hour  Intake 2334.67 ml  Output -  Net 2334.67 ml    Exam Patient is examined daily including today on 03/11/18 , exams remain the same as of yesterday except that has changed   Gen:- Awake Alert,  In no apparent distress  HEENT:- [place].AT, No sclera icterus Neck-Supple Neck,No JVD,.  Lungs-  CTAB , good air movement CV- S1, S2 normal Abd-  +ve B.Sounds, Abd Soft, No tenderness,    Psych- affect is appropriate, oriented x3 Neuro-no new focal deficits, no tremors Lt Leg/Skin- LLE lateral aspect: Proximal:1.2cm x 1.2cm x 0.2cm with red, moist wound bed (scant amount of serous exudate) Distal: 2cm x 2.5cm x 0.5cm with 25% red and 75% yellow slough. scant amount of serous exudate Wound bed:As described above Drainage - scant serous draiange Periwound: Much improved erythema, much improved swelling, resolving warmth, no streaking, much improved tenderness    Data Review   CBC w Diff:  Lab Results  Component Value Date   WBC 10.1 03/11/2018   HGB 11.5 (L) 03/11/2018   HCT 35.4 (L) 03/11/2018   PLT 409 (H) 03/11/2018   LYMPHOPCT 27 04/04/2009   MONOPCT 8 04/04/2009   EOSPCT 2 04/04/2009   BASOPCT 1 04/04/2009    CMP:  Lab Results  Component Value Date   NA 138 03/11/2018   K 4.4 03/11/2018   CL 102 03/11/2018   CO2 26 03/11/2018   BUN 12 03/11/2018   CREATININE 1.06 03/11/2018   PROT 7.5 04/03/2009   ALBUMIN 4.5 04/03/2009   BILITOT 0.6 04/03/2009   ALKPHOS 61 04/03/2009   AST 23 04/03/2009   ALT 29 04/03/2009    Total Discharge time is about 33 minutes  Roxan Hockey M.D on 03/11/2018 at 2:11 PM  Pager---[phone]  Go to [email or web address] - password [email or web address] for contact info  [facility] - Office   [phone]

## 2018-03-11 NOTE — Progress Notes (Signed)
Patient discharging home.  IV removed - WNL.  Reviewed AVS and medications.  Patient educated and demonstrated using the teach back method how to check his blood glucose and administer insulin to self.  Patient verbalizes understanding and is able to correctly do steps.  Instructed to follow up with a medical provider. Emphasized importance of completing abx and wound care.  Supplies given.  Note also supplied to patient from MD to give to employer.  Patient has no questions at this time.  Patient in NAD.  Cigarettes returned to patient - also advised on smoking cessation.

## 2018-03-13 LAB — CULTURE, BLOOD (ROUTINE X 2)
CULTURE: NO GROWTH
Culture: NO GROWTH
SPECIAL REQUESTS: ADEQUATE
Special Requests: ADEQUATE

## 2024-05-30 ENCOUNTER — Other Ambulatory Visit: Payer: Self-pay

## 2024-05-30 ENCOUNTER — Inpatient Hospital Stay (HOSPITAL_COMMUNITY)
Admission: EM | Admit: 2024-05-30 | Discharge: 2024-06-03 | DRG: 871 | Disposition: A | Attending: Family Medicine | Admitting: Family Medicine

## 2024-05-30 ENCOUNTER — Inpatient Hospital Stay (HOSPITAL_COMMUNITY)

## 2024-05-30 DIAGNOSIS — A419 Sepsis, unspecified organism: Secondary | ICD-10-CM | POA: Diagnosis present

## 2024-05-30 DIAGNOSIS — Z7902 Long term (current) use of antithrombotics/antiplatelets: Secondary | ICD-10-CM | POA: Diagnosis not present

## 2024-05-30 DIAGNOSIS — N179 Acute kidney failure, unspecified: Secondary | ICD-10-CM | POA: Diagnosis present

## 2024-05-30 DIAGNOSIS — K5900 Constipation, unspecified: Secondary | ICD-10-CM | POA: Diagnosis present

## 2024-05-30 DIAGNOSIS — I69351 Hemiplegia and hemiparesis following cerebral infarction affecting right dominant side: Secondary | ICD-10-CM | POA: Diagnosis not present

## 2024-05-30 DIAGNOSIS — I110 Hypertensive heart disease with heart failure: Secondary | ICD-10-CM | POA: Diagnosis present

## 2024-05-30 DIAGNOSIS — I6930 Unspecified sequelae of cerebral infarction: Secondary | ICD-10-CM | POA: Diagnosis not present

## 2024-05-30 DIAGNOSIS — Z79899 Other long term (current) drug therapy: Secondary | ICD-10-CM | POA: Diagnosis not present

## 2024-05-30 DIAGNOSIS — N390 Urinary tract infection, site not specified: Secondary | ICD-10-CM

## 2024-05-30 DIAGNOSIS — E119 Type 2 diabetes mellitus without complications: Secondary | ICD-10-CM

## 2024-05-30 DIAGNOSIS — R6520 Severe sepsis without septic shock: Secondary | ICD-10-CM | POA: Diagnosis present

## 2024-05-30 DIAGNOSIS — Z7901 Long term (current) use of anticoagulants: Secondary | ICD-10-CM

## 2024-05-30 DIAGNOSIS — Z87891 Personal history of nicotine dependence: Secondary | ICD-10-CM | POA: Diagnosis not present

## 2024-05-30 DIAGNOSIS — I10 Essential (primary) hypertension: Secondary | ICD-10-CM | POA: Diagnosis not present

## 2024-05-30 DIAGNOSIS — R338 Other retention of urine: Secondary | ICD-10-CM | POA: Diagnosis present

## 2024-05-30 DIAGNOSIS — I5033 Acute on chronic diastolic (congestive) heart failure: Secondary | ICD-10-CM | POA: Diagnosis present

## 2024-05-30 DIAGNOSIS — E1151 Type 2 diabetes mellitus with diabetic peripheral angiopathy without gangrene: Secondary | ICD-10-CM | POA: Diagnosis present

## 2024-05-30 DIAGNOSIS — I739 Peripheral vascular disease, unspecified: Secondary | ICD-10-CM | POA: Diagnosis present

## 2024-05-30 DIAGNOSIS — Z7984 Long term (current) use of oral hypoglycemic drugs: Secondary | ICD-10-CM | POA: Diagnosis not present

## 2024-05-30 DIAGNOSIS — R531 Weakness: Secondary | ICD-10-CM | POA: Diagnosis present

## 2024-05-30 DIAGNOSIS — R600 Localized edema: Secondary | ICD-10-CM | POA: Diagnosis present

## 2024-05-30 DIAGNOSIS — Z833 Family history of diabetes mellitus: Secondary | ICD-10-CM | POA: Diagnosis not present

## 2024-05-30 DIAGNOSIS — Z794 Long term (current) use of insulin: Secondary | ICD-10-CM | POA: Diagnosis not present

## 2024-05-30 DIAGNOSIS — Z8249 Family history of ischemic heart disease and other diseases of the circulatory system: Secondary | ICD-10-CM | POA: Diagnosis not present

## 2024-05-30 DIAGNOSIS — N401 Enlarged prostate with lower urinary tract symptoms: Secondary | ICD-10-CM | POA: Diagnosis present

## 2024-05-30 DIAGNOSIS — E0822 Diabetes mellitus due to underlying condition with diabetic chronic kidney disease: Secondary | ICD-10-CM

## 2024-05-30 DIAGNOSIS — A4159 Other Gram-negative sepsis: Secondary | ICD-10-CM | POA: Diagnosis present

## 2024-05-30 DIAGNOSIS — D649 Anemia, unspecified: Secondary | ICD-10-CM | POA: Diagnosis present

## 2024-05-30 LAB — URINALYSIS, W/ REFLEX TO CULTURE (INFECTION SUSPECTED)
Bilirubin Urine: NEGATIVE
Glucose, UA: NEGATIVE mg/dL
Ketones, ur: NEGATIVE mg/dL
Nitrite: NEGATIVE
Protein, ur: >=300 mg/dL — AB
RBC / HPF: >50 RBC/hpf (ref 0–5)
Specific Gravity, Urine: 1.01 (ref 1.005–1.030)
WBC, UA: >50 WBC/hpf (ref 0–5)
pH: 8 (ref 5.0–8.0)

## 2024-05-30 LAB — CBC WITH DIFFERENTIAL/PLATELET
Abs Immature Granulocytes: 0.11 K/uL — ABNORMAL HIGH (ref 0.00–0.07)
Basophils Absolute: 0.1 K/uL (ref 0.0–0.1)
Basophils Relative: 0 %
Eosinophils Absolute: 0 K/uL (ref 0.0–0.5)
Eosinophils Relative: 0 %
HCT: 38.1 % — ABNORMAL LOW (ref 39.0–52.0)
Hemoglobin: 12.3 g/dL — ABNORMAL LOW (ref 13.0–17.0)
Immature Granulocytes: 1 %
Lymphocytes Relative: 5 %
Lymphs Abs: 1.1 K/uL (ref 0.7–4.0)
MCH: 30.6 pg (ref 26.0–34.0)
MCHC: 32.3 g/dL (ref 30.0–36.0)
MCV: 94.8 fL (ref 80.0–100.0)
Monocytes Absolute: 1.4 K/uL — ABNORMAL HIGH (ref 0.1–1.0)
Monocytes Relative: 6 %
Neutro Abs: 19.5 K/uL — ABNORMAL HIGH (ref 1.7–7.7)
Neutrophils Relative %: 88 %
Platelets: 499 K/uL — ABNORMAL HIGH (ref 150–400)
RBC: 4.02 MIL/uL — ABNORMAL LOW (ref 4.22–5.81)
RDW: 13.2 % (ref 11.5–15.5)
WBC: 22.1 K/uL — ABNORMAL HIGH (ref 4.0–10.5)
nRBC: 0 % (ref 0.0–0.2)

## 2024-05-30 LAB — COMPREHENSIVE METABOLIC PANEL WITH GFR
ALT: 11 U/L (ref 0–44)
AST: 17 U/L (ref 15–41)
Albumin: 4.2 g/dL (ref 3.5–5.0)
Alkaline Phosphatase: 91 U/L (ref 38–126)
Anion gap: 15 (ref 5–15)
BUN: 23 mg/dL (ref 8–23)
CO2: 21 mmol/L — ABNORMAL LOW (ref 22–32)
Calcium: 10.2 mg/dL (ref 8.9–10.3)
Chloride: 103 mmol/L (ref 98–111)
Creatinine, Ser: 1.89 mg/dL — ABNORMAL HIGH (ref 0.61–1.24)
GFR, Estimated: 39 mL/min — ABNORMAL LOW (ref >60)
Glucose, Bld: 223 mg/dL — ABNORMAL HIGH (ref 70–99)
Potassium: 5.1 mmol/L (ref 3.5–5.1)
Sodium: 139 mmol/L (ref 135–145)
Total Bilirubin: 0.3 mg/dL (ref 0.0–1.2)
Total Protein: 7.4 g/dL (ref 6.5–8.1)

## 2024-05-30 LAB — HEMOGLOBIN A1C
Hgb A1c MFr Bld: 6.5 % — ABNORMAL HIGH (ref 4.8–5.6)
Mean Plasma Glucose: 139.85 mg/dL

## 2024-05-30 LAB — GLUCOSE, CAPILLARY: Glucose-Capillary: 147 mg/dL — ABNORMAL HIGH (ref 70–99)

## 2024-05-30 LAB — PRO BRAIN NATRIURETIC PEPTIDE: Pro Brain Natriuretic Peptide: 1002 pg/mL — ABNORMAL HIGH (ref <300.0)

## 2024-05-30 LAB — CBG MONITORING, ED
Glucose-Capillary: 221 mg/dL — ABNORMAL HIGH (ref 70–99)
Glucose-Capillary: 333 mg/dL — ABNORMAL HIGH (ref 70–99)

## 2024-05-30 LAB — LACTIC ACID, PLASMA: Lactic Acid, Venous: 1.7 mmol/L (ref 0.5–1.9)

## 2024-05-30 MED ORDER — SODIUM CHLORIDE 0.9 % IV SOLN
2.0000 g | Freq: Once | INTRAVENOUS | Status: AC
  Administered 2024-05-30: 2 g via INTRAVENOUS
  Filled 2024-05-30: qty 20

## 2024-05-30 MED ORDER — ATORVASTATIN CALCIUM 40 MG PO TABS
80.0000 mg | ORAL_TABLET | Freq: Every day | ORAL | Status: DC
  Administered 2024-05-31 – 2024-06-03 (×4): 80 mg via ORAL
  Filled 2024-05-30 (×4): qty 2

## 2024-05-30 MED ORDER — NIFEDIPINE ER OSMOTIC RELEASE 30 MG PO TB24
90.0000 mg | ORAL_TABLET | Freq: Every day | ORAL | Status: DC
  Administered 2024-05-30 – 2024-06-03 (×5): 90 mg via ORAL
  Filled 2024-05-30 (×5): qty 3

## 2024-05-30 MED ORDER — CARVEDILOL 3.125 MG PO TABS
6.2500 mg | ORAL_TABLET | Freq: Two times a day (BID) | ORAL | Status: DC
  Administered 2024-05-30 – 2024-06-03 (×8): 6.25 mg via ORAL
  Filled 2024-05-30 (×8): qty 2

## 2024-05-30 MED ORDER — FINASTERIDE 5 MG PO TABS
5.0000 mg | ORAL_TABLET | Freq: Every day | ORAL | Status: DC
  Administered 2024-05-31 – 2024-06-03 (×4): 5 mg via ORAL
  Filled 2024-05-30 (×4): qty 1

## 2024-05-30 MED ORDER — ACETAMINOPHEN 650 MG RE SUPP: 650.0000 mg | Freq: Four times a day (QID) | RECTAL | Status: DC | PRN

## 2024-05-30 MED ORDER — ONDANSETRON HCL 4 MG/2ML IJ SOLN: 4.0000 mg | Freq: Four times a day (QID) | INTRAMUSCULAR | Status: DC | PRN

## 2024-05-30 MED ORDER — SODIUM CHLORIDE 0.9 % IV BOLUS
500.0000 mL | Freq: Once | INTRAVENOUS | Status: AC
  Administered 2024-05-30: 500 mL via INTRAVENOUS

## 2024-05-30 MED ORDER — TAMSULOSIN HCL 0.4 MG PO CAPS
0.8000 mg | ORAL_CAPSULE | Freq: Every day | ORAL | Status: DC
  Administered 2024-05-31 – 2024-06-03 (×4): 0.8 mg via ORAL
  Filled 2024-05-30 (×4): qty 2

## 2024-05-30 MED ORDER — RIVAROXABAN 2.5 MG PO TABS
2.5000 mg | ORAL_TABLET | Freq: Two times a day (BID) | ORAL | Status: DC
  Administered 2024-05-31 – 2024-06-03 (×8): 2.5 mg via ORAL
  Filled 2024-05-30 (×11): qty 1

## 2024-05-30 MED ORDER — ONDANSETRON HCL 4 MG PO TABS: 4.0000 mg | ORAL_TABLET | Freq: Four times a day (QID) | ORAL | Status: DC | PRN

## 2024-05-30 MED ORDER — SODIUM CHLORIDE 0.9 % IV SOLN
2.0000 g | INTRAVENOUS | Status: DC
  Administered 2024-05-31 – 2024-06-02 (×3): 2 g via INTRAVENOUS
  Filled 2024-05-30 (×3): qty 20

## 2024-05-30 MED ORDER — CLOPIDOGREL BISULFATE 75 MG PO TABS
75.0000 mg | ORAL_TABLET | Freq: Every day | ORAL | Status: DC
  Administered 2024-05-31 – 2024-06-03 (×4): 75 mg via ORAL
  Filled 2024-05-30 (×4): qty 1

## 2024-05-30 MED ORDER — INSULIN ASPART 100 UNIT/ML IJ SOLN
0.0000 [IU] | Freq: Three times a day (TID) | INTRAMUSCULAR | Status: DC
  Administered 2024-05-30: 11 [IU] via SUBCUTANEOUS
  Administered 2024-05-31: 5 [IU] via SUBCUTANEOUS
  Administered 2024-05-31 – 2024-06-01 (×4): 3 [IU] via SUBCUTANEOUS
  Administered 2024-06-01: 5 [IU] via SUBCUTANEOUS
  Administered 2024-06-02 (×2): 3 [IU] via SUBCUTANEOUS
  Administered 2024-06-02: 5 [IU] via SUBCUTANEOUS
  Administered 2024-06-03 (×2): 3 [IU] via SUBCUTANEOUS
  Filled 2024-05-30 (×11): qty 1

## 2024-05-30 MED ORDER — CHLORHEXIDINE GLUCONATE CLOTH 2 % EX PADS
6.0000 | MEDICATED_PAD | Freq: Every day | CUTANEOUS | Status: DC
  Administered 2024-05-30 – 2024-06-03 (×3): 6 via TOPICAL

## 2024-05-30 MED ORDER — INSULIN ASPART 100 UNIT/ML IJ SOLN
0.0000 [IU] | Freq: Every day | INTRAMUSCULAR | Status: DC
  Administered 2024-06-02: 2 [IU] via SUBCUTANEOUS
  Filled 2024-05-30: qty 1

## 2024-05-30 MED FILL — Polyethylene Glycol 3350 Oral Packet 17 GM: 17.0000 g | ORAL | Qty: 1 | Status: AC

## 2024-05-30 MED FILL — Acetaminophen Tab 325 MG: 650.0000 mg | ORAL | Qty: 2 | Status: AC

## 2024-05-30 NOTE — ED Notes (Signed)
 Writer called 3rd floor to notify that patient is being brought up to them at this time.

## 2024-05-30 NOTE — ED Triage Notes (Signed)
 Pt stated his feet begin to swell bilateral since thanksgiving. Pt denies any pain at this time.

## 2024-05-30 NOTE — ED Provider Notes (Signed)
 [place] [facility] AT [facility] Provider Note   CSN: [id number] Arrival date & time: 05/30/24  1256     Patient presents with: Leg Swelling   Jason Graham is a 63 y.o. male.   HPI      Jason Graham is a 63 y.o. male past medical history of type 2 diabetes, hypertension, anemia and prior stroke.  He presents to the Emergency Department complaining of swelling redness bilateral lower extremities.  Symptoms present for 1 month, gradually worsening.  He states he has been living in [place]  and returned home a month ago and swelling has been worse since then.  He has had recurrent cellulitis of his lower legs.  States he underwent a procedure while in [place]  to clean the veins out of my left leg.  He is currently anticoagulated due to history of stroke.  He denies any known injury, also denies any fever chills chest pain or shortness of breath.  No numbness of his lower extremities.  He takes Lasix  20 mg twice a day denies any missed doses.  He is also complaining of difficulty urinating and foul-smelling urine.  Symptoms 3 days.  He states he is unable to fully empty his bladder or even urinate without standing up or bending over. Denies flank pain or hematuria    Prior to Admission medications  Medication Sig Start Date End Date Taking? Authorizing Provider  acetaminophen  (TYLENOL ) 325 MG tablet Take 2 tablets (650 mg total) by mouth every 6 (six) hours as needed for mild pain (or Fever >/= 101). 03/11/18   Emokpae, Courage, MD  Alcohol  Swabs (ALCOHOL  WIPES) 70 % PADS 1 application by Does not apply route 4 (four) times daily -  before meals and at bedtime. For blood sugar check 03/11/18   Pearlean Manus, MD  Bismuth Tribromoph-Petrolatum (XEROFORM PETROLAT GAUZE 5X9) MISC Apply 1 Package topically 2 (two) times daily. For leg wound dressing change 03/11/18   Pearlean Manus, MD  blood glucose meter kit and supplies Relion Prime or Dispense other brand based on  patient and insurance preference. Use up to four times daily as directed. (FOR ICD-9 250.00, 250.01). 03/11/18   Pearlean Manus, MD  cephALEXin  (KEFLEX ) 500 MG capsule Take 1 capsule (500 mg total) by mouth 3 (three) times daily. 03/11/18   Pearlean Manus, MD  Gauze Bandages (CONFORMING ROLLED GAUZE) MISC 1 application by Does not apply route 2 (two) times daily. For left leg wound dressing change 03/11/18   Pearlean Manus, MD  Gauze Pads & Dressings (GAUZE PADS 4X4) 4X4 PADS Apply 2 Doses topically 2 (two) times daily. For leg wound dressing change 03/11/18   Pearlean Manus, MD  Insulin  Glargine (LANTUS ) 100 UNIT/ML Solostar Pen Inject 20 Units into the skin at bedtime. 03/11/18   Pearlean Manus, MD  lactobacillus acidophilus & bulgar (LACTINEX) chewable tablet Chew 1 tablet by mouth 3 (three) times daily with meals. 03/11/18   Pearlean Manus, MD  lisinopril  (PRINIVIL ,ZESTRIL ) 10 MG tablet Take 1 tablet (10 mg total) by mouth daily. 03/11/18   Pearlean Manus, MD  metFORMIN  (GLUCOPHAGE ) 1000 MG tablet Take 1 tablet (1,000 mg total) by mouth 2 (two) times daily with a meal. 03/11/18   Pearlean Manus, MD  OVER THE COUNTER MEDICATION Apply 1 application topically daily. blue star ointment to psoriasis    [provider]  SODIUM CHLORIDE , EXTERNAL, (SALINE WOUND WASH) 0.9 % SOLN Apply 5 mLs topically 2 (two) times daily. For leg wound dressing change 03/11/18  Pearlean Manus, MD    Allergies: Patient has no known allergies.    Review of Systems  Genitourinary:  Positive for difficulty urinating.  Skin:  Positive for color change and wound.  Neurological:  Positive for weakness.  All other systems reviewed and are negative.   Updated Vital Signs BP 136/81 (BP Location: Right Arm)   Pulse (!) 115   Temp 98.4 F (36.9 C) (Oral)   Resp (!) 22   Ht 5' 8 (1.727 m)   Wt 70.3 kg   SpO2 92%   BMI 23.57 kg/m   Physical Exam Vitals and nursing note reviewed.   Constitutional:      General: He is not in acute distress.    Appearance: He is not toxic-appearing.  Cardiovascular:     Rate and Rhythm: Regular rhythm. Tachycardia present.     Pulses: Normal pulses.     Comments: Dorsalis pedis pulses heard bilaterally with portable Doppler Pulmonary:     Effort: Pulmonary effort is normal.  Abdominal:     Palpations: Abdomen is soft.     Tenderness: There is abdominal tenderness.     Comments: Tenderness and distention to the suprapubic area.  Remaining abdomen is soft nontender  Musculoskeletal:     Right lower leg: Edema present.     Left lower leg: Edema present.  Skin:    General: Skin is warm.     Capillary Refill: Capillary refill takes 2 to 3 seconds.     Findings: Erythema present.     Comments: Some erythema to the anterior bilateral lower legs.  There is some scabbed lesions anteriorly.  There is chronic appearing ulcerations to the left heel and left great toe.  No weeping or purulent drainage.  See attached photo  Neurological:     General: No focal deficit present.     Mental Status: He is alert.     Sensory: No sensory deficit.     Motor: No weakness.          (all labs ordered are listed, but only abnormal results are displayed) Labs Reviewed  CBC WITH DIFFERENTIAL/PLATELET - Abnormal; Notable for the following components:      Result Value   WBC 22.1 (*)    RBC 4.02 (*)    Hemoglobin 12.3 (*)    HCT 38.1 (*)    Platelets 499 (*)    Neutro Abs 19.5 (*)    Monocytes Absolute 1.4 (*)    Abs Immature Granulocytes 0.11 (*)    All other components within normal limits  COMPREHENSIVE METABOLIC PANEL WITH GFR - Abnormal; Notable for the following components:   CO2 21 (*)    Glucose, Bld 223 (*)    Creatinine, Ser 1.89 (*)    GFR, Estimated 39 (*)    All other components within normal limits  URINALYSIS, W/ REFLEX TO CULTURE (INFECTION SUSPECTED) - Abnormal; Notable for the following components:   APPearance  CLOUDY (*)    Hgb urine dipstick MODERATE (*)    Protein, ur >=300 (*)    Leukocytes,Ua LARGE (*)    Bacteria, UA RARE (*)    Non Squamous Epithelial 0-5 (*)    All other components within normal limits  CBG MONITORING, ED - Abnormal; Notable for the following components:   Glucose-Capillary 221 (*)    All other components within normal limits  CULTURE, BLOOD (ROUTINE X 2)  CULTURE, BLOOD (ROUTINE X 2)  URINE CULTURE  LACTIC ACID, PLASMA  LACTIC ACID, PLASMA  EKG: EKG Interpretation Date/Time:  Thursday May 30 2024 13:08:02 EST Ventricular Rate:  115 PR Interval:  173 QRS Duration:  98 QT Interval:  316 QTC Calculation: 437 R Axis:   12  Text Interpretation: Sinus tachycardia Low voltage, precordial leads Probable anteroseptal infarct, old Non-specific ST-t changes Since last tracing rate faster Confirmed by Cleotilde Rogue ([id number]) on 05/30/2024 1:30:13 PM  Radiology: No results found.   Procedures    CRITICAL CARE Performed by: George Alcantar Total critical care time: 40 minutes Critical care time was exclusive of separately billable procedures and treating other patients. Critical care was necessary to treat or prevent imminent or life-threatening deterioration. Critical care was time spent personally by me on the following activities: development of treatment plan with patient and/or surrogate as well as nursing, discussions with consultants, evaluation of patient's response to treatment, examination of patient, obtaining history from patient or surrogate, ordering and performing treatments and interventions, ordering and review of laboratory studies, ordering and review of radiographic studies, pulse oximetry and re-evaluation of patient's condition.  Medications Ordered in the ED  insulin  aspart (novoLOG ) injection 0-15 Units (has no administration in time range)  insulin  aspart (novoLOG ) injection 0-5 Units (has no administration in time range)  cefTRIAXone   (ROCEPHIN ) 2 g in sodium chloride  0.9 % 100 mL IVPB (0 g Intravenous Stopped 05/30/24 1526)  sodium chloride  0.9 % bolus 500 mL (500 mLs Intravenous New Bag/Given 05/30/24 1536)                                    Medical Decision Making   Patient here with complaints of swelling and redness to the both lower legs nonhealing wound to the heel of his left foot.  He denies any significant pain of his legs and he is anticoagulated.  He states symptoms have been present for about 1 month.  He recently moved back here from [place] .  Denies any recent fall.  He also complains of dysuria symptoms, foul-smelling urine with pain with urination.  He has been difficulty urinating since Monday and states he can only urinate when he stands or bends forward.  Denies any fever chills nausea or vomiting.  On my exam, bladder feels grossly distended, he is dribbling urine when I palpate the bladder and it is malodorous.,  He appears hemodynamically stable, he was tachycardic on arrival, but has improved on recheck.  His bladder was grossly distended on my exam, bladder scan revealed greater than 800 cc of urine.  Foley catheter was placed and he has drained greater than a liter of dark-colored urine.  Abdomen now soft.  Patient is hemodynamically stable but I am concerned about possible sepsis.  Feel this is more likely related to urinary source than cellulitis.  He is anticoagulated due to his prior stroke, and he does not have tenderness of his posterior lower legs.  I feel that acute DVT is less concerning.  No chest pain or shortness of breath to suggest PE    Amount and/or Complexity of Data Reviewed Labs: ordered.    Details: Leukocytosis with white count of 22,000, lactic acid unremarkable.  Chemistry shows serum creatinine elevated at 1.89.  I do not have any recent lab values for comparison.  His urine shows cloudy urine with moderate blood large leukocytes and greater than 50 white cells with rare  bacteria.  Culture is pending along with blood cultures pending ECG/medicine tests:  ordered.    Details: EKG shows sinus tachycardia, nonspecific ST and T wave changes.  Rate faster than previous EKG Discussion of management or test interpretation with external provider(s):   Patient also seen by Dr. Cleotilde and care plan discussed.  I suspect his leukocytosis and urinary symptoms are secondary to acute urinary retention and sepsis secondary to infection.  He does not appear in shock.  2 g of Rocephin  ordered blood and urine are cultured.  Patient agreeable to hospital admission  Discussed with [facility] hospitalist, Dr. FORBES Carwin who agrees to admit    Risk Decision regarding hospitalization.        Final diagnoses:  Sepsis secondary to UTI [facility])    ED Discharge Orders     None          Herlinda Milling, PA-C 05/30/24 1620    Cleotilde Rogue, MD 06/02/24 [phone]

## 2024-05-30 NOTE — H&P (Addendum)
 History and Physical    Jason Graham FMW:[id number] DOB: 1960/07/01 DOA: 05/30/2024  PCP: Waddell Eleanor CROME, MD   Patient coming from: Home  I have personally briefly reviewed patient's old medical records in [facility]  Chief Complaint: Leg swelling  HPI: Wardell Pokorski is a 63 y.o. male with medical history significant for stroke with residual right-sided deficits, diabetes mellitus, hypertension. Patient presented to the ED with complaints of bilateral lower extremity swelling ongoing over the past 3 weeks.  No pain or redness to his lower extremities.  No difficulty breathing.  No cough no chest pain.  He denies history of heart failure, he is on Lasix 20 mg daily and Xarelto, he reports compliance. He also reports difficulty with urination over the past 3 weeks.  He is unable to void standing up and sometimes would void small amounts when sitting down, he also reports burning pain with urination.  He was quite uncomfortable prior to arrival, but after insertion of Foley catheter his abdomen feels much better.  No flank pain.  He is on tamsulosin and finasteride.  Patient has been living in [place]  for the past 5 years, and came back about a month ago.  He will be staying here indefinitely.   ED Course: Temperature 98.4.  Heart rate 100-115.  Respirate rate 17-22.  Blood pressure systolic 136-140.  O2 sat greater 92% on room air. WBC 22.1. Lactic acid 1.7. UA with large leukocytes. Bladder was distended, catheter was inserted with drainage of ~1.5 L of urine. 500 mL bolus given. IV ceftriaxone 2 g daily started. Hospitalist to admit for UTI with sepsis.  Review of Systems: As per HPI all other systems reviewed and negative.  Past Medical History:  Diagnosis Date   Arthritis    Diabetes mellitus without complication (HCC)    Hypertension     Past Surgical History:  Procedure Laterality Date   LAPAROTOMY N/A 04/07/2013   Procedure: EXPLORATORY LAPAROTOMY, REPAIR OF SMALL  BOWEL ENTEROTOMY, REPAIR OF FASCIAL DEFECT;  Surgeon: Vicenta DELENA Poli, MD;  Location: [facility] OR;  Service: General;  Laterality: N/A;     reports that he has been smoking. He has never used smokeless tobacco. He reports current alcohol  use. He reports that he does not use drugs.  Allergies[1]  Family History  Problem Relation Age of Onset   Diabetes Mother    Heart disease Mother    Diabetes Father    Heart disease Father     Prior to Admission medications  Medication Sig Start Date End Date Taking? Authorizing Provider  HYDROcodone-acetaminophen  (NORCO/VICODIN) 5-325 MG tablet Take 1 tablet by mouth every 6 (six) hours as needed. 04/22/24  Yes [provider]  acetaminophen  (TYLENOL ) 325 MG tablet Take 2 tablets (650 mg total) by mouth every 6 (six) hours as needed for mild pain (or Fever >/= 101). 03/11/18   Sible Straley, Courage, MD  Alcohol  Swabs (ALCOHOL  WIPES) 70 % PADS 1 application by Does not apply route 4 (four) times daily -  before meals and at bedtime. For blood sugar check 03/11/18   Pearlean Manus, MD  atorvastatin (LIPITOR) 80 MG tablet Take 80 mg by mouth daily. 05/07/24   [provider]  Bismuth Tribromoph-Petrolatum (XEROFORM PETROLAT GAUZE 5X9) MISC Apply 1 Package topically 2 (two) times daily. For leg wound dressing change 03/11/18   Pearlean Manus, MD  blood glucose meter kit and supplies Relion Prime or Dispense other brand based on patient and insurance preference. Use up to four  times daily as directed. (FOR ICD-9 250.00, 250.01). 03/11/18   Pearlean Manus, MD  carvedilol (COREG) 6.25 MG tablet Take 6.25 mg by mouth 2 (two) times daily. 05/07/24   [provider]  cephALEXin  (KEFLEX ) 500 MG capsule Take 1 capsule (500 mg total) by mouth 3 (three) times daily. 03/11/18   Pearlean Manus, MD  clopidogrel (PLAVIX) 75 MG tablet Take 75 mg by mouth daily. 05/07/24   [provider]  finasteride (PROSCAR) 5 MG tablet Take 5 mg by mouth  daily. 05/16/24   [provider]  furosemide (LASIX) 20 MG tablet Take 20 mg by mouth 2 (two) times daily. 05/07/24   [provider]  gabapentin (NEURONTIN) 100 MG capsule Take 200 mg by mouth 3 (three) times daily. 05/10/24   [provider]  Gauze Bandages (CONFORMING ROLLED GAUZE) MISC 1 application by Does not apply route 2 (two) times daily. For left leg wound dressing change 03/11/18   Pearlean Manus, MD  Gauze Pads & Dressings (GAUZE PADS 4X4) 4X4 PADS Apply 2 Doses topically 2 (two) times daily. For leg wound dressing change 03/11/18   Pearlean Manus, MD  Insulin  Glargine (LANTUS ) 100 UNIT/ML Solostar Pen Inject 20 Units into the skin at bedtime. 03/11/18   Onisha Cedeno, Courage, MD  JANUVIA 100 MG tablet Take 100 mg by mouth daily. 05/09/24   [provider]  lactobacillus acidophilus & bulgar (LACTINEX) chewable tablet Chew 1 tablet by mouth 3 (three) times daily with meals. 03/11/18   Pearlean Manus, MD  lisinopril  (ZESTRIL ) 5 MG tablet Take 5 mg by mouth daily. 05/18/24   [provider]  metFORMIN  (GLUCOPHAGE -XR) 500 MG 24 hr tablet Take 1,000 mg by mouth 2 (two) times daily. 05/07/24   [provider]  NIFEdipine (PROCARDIA XL/NIFEDICAL-XL) 90 MG 24 hr tablet Take 90 mg by mouth daily. 05/07/24   [provider]  OVER THE COUNTER MEDICATION Apply 1 application topically daily. blue star ointment to psoriasis    [provider]  potassium chloride  SA (KLOR-CON  M) 20 MEQ tablet Take 20 mEq by mouth 2 (two) times daily. 05/07/24   [provider]  SODIUM CHLORIDE , EXTERNAL, (SALINE WOUND WASH) 0.9 % SOLN Apply 5 mLs topically 2 (two) times daily. For leg wound dressing change 03/11/18   Pearlean Manus, MD  tamsulosin (FLOMAX) 0.4 MG CAPS capsule Take 0.8 mg by mouth daily. 05/16/24   [provider]  XARELTO 2.5 MG TABS tablet Take 2.5 mg by mouth 2 (two) times daily. 05/07/24   [provider]    Physical Exam: Vitals:   05/30/24 1309 05/30/24 1310 05/30/24 1345 05/30/24 1400  BP: 136/81  (!) 140/82 138/70  Pulse: (!) 115  (!) 102 100  Resp: (!) 22  17 20   Temp: 98.4 F (36.9 C)     TempSrc: Oral     SpO2: 92%  95% 97%  Weight:  70.3 kg    Height:  5' 8 (1.727 m)      Constitutional: NAD, calm, comfortable Vitals:   05/30/24 1309 05/30/24 1310 05/30/24 1345 05/30/24 1400  BP: 136/81  (!) 140/82 138/70  Pulse: (!) 115  (!) 102 100  Resp: (!) 22  17 20   Temp: 98.4 F (36.9 C)     TempSrc: Oral     SpO2: 92%  95% 97%  Weight:  70.3 kg    Height:  5' 8 (1.727 m)     Eyes: PERRL, lids and conjunctivae normal ENMT: Mucous  membranes are moist.   Neck: normal, supple, no masses, no thyromegaly Respiratory: clear to auscultation bilaterally, no wheezing, no crackles. Normal respiratory effort. No accessory muscle use.  Cardiovascular: Tachycardic regular rate and rhythm, no murmurs / rubs / gallops.  2+ pitting bilateral lower extremity edema to knees.  Extremities warm  Abdomen: no tenderness, no masses palpated. No hepatosplenomegaly. Bowel sounds positive.  Musculoskeletal: no clubbing / cyanosis. No joint deformity upper and lower extremities.  Skin: Bruising to anterior shins of bilateral lower extremities secondary to recent fall  Neurologic: No facial asymmetry, baseline and unchanged right hemiparesis from prior stroke Psychiatric: Normal judgment and insight. Alert and oriented x 3. Normal mood.   Labs on Admission: I have personally reviewed following labs and imaging studies  CBC: Recent Labs  Lab 05/30/24 1322  WBC 22.1*  NEUTROABS 19.5*  HGB 12.3*  HCT 38.1*  MCV 94.8  PLT 499*   Basic Metabolic Panel: Recent Labs  Lab 05/30/24 1322  NA 139  K 5.1  CL 103  CO2 21*  GLUCOSE 223*  BUN 23  CREATININE 1.89*  CALCIUM 10.2   GFR: Estimated Creatinine Clearance: 37.7 mL/min (A) (by C-G formula based on SCr of 1.89 mg/dL  (H)). Liver Function Tests: Recent Labs  Lab 05/30/24 1322  AST 17  ALT 11  ALKPHOS 91  BILITOT 0.3  PROT 7.4  ALBUMIN  4.2   CBG: Recent Labs  Lab 05/30/24 1325  GLUCAP 221*   Urine analysis:    Component Value Date/Time   COLORURINE YELLOW 05/30/2024 1340   APPEARANCEUR CLOUDY (A) 05/30/2024 1340   LABSPEC 1.010 05/30/2024 1340   PHURINE 8.0 05/30/2024 1340   GLUCOSEU NEGATIVE 05/30/2024 1340   HGBUR MODERATE (A) 05/30/2024 1340   BILIRUBINUR NEGATIVE 05/30/2024 1340   KETONESUR NEGATIVE 05/30/2024 1340   PROTEINUR >=300 (A) 05/30/2024 1340   NITRITE NEGATIVE 05/30/2024 1340   LEUKOCYTESUR LARGE (A) 05/30/2024 1340    Radiological Exams on Admission: No results found.  EKG: Independently reviewed.  Sinus tachycardia rate 115.  QTc 437.  Artifacts present. No prior EKG to compare.  Assessment/Plan Principal Problem:   Sepsis secondary to UTI [facility]) Active Problems:   Bilateral lower extremity edema   Acute urinary retention   AKI (acute kidney injury)   DM (diabetes mellitus) (HCC)   Essential hypertension uncontrolled   History of stroke with residual deficit   Chronic anticoagulation   Peripheral artery disease   Assessment and Plan:  Severe sepsis secondary to UTI- presented with dysuria, meet sepsis criteria with tachycardia heart rates 100-115, and leukocytosis of 22.1.  Normal lactic acid 1.7.  With evidence of endorgan dysfunction -AKI.  UA suggestive of UTI with large leukocytes. Has been in [place]  for the past 5 years, came back about 2 months ago and be staying indefinitely. - No prior urine cultures on file, IV ceftriaxone 2 g daily - Abdominal ultrasound - Follow-up blood and urine cultures - 100 mL bolus given.  Urinary retention, AKI-catheter drained ~  1.5 L of urine from patient's bladder.  Creatinine elevated 1.89, unknown baseline.  AKI likely secondary to urinary retention and sepsis.  Of BPH. - Resume tamsulosin and finasteride -  Insert Foley catheter - Will need to follow-up with urology  Bilateral lower extremity edema-at least 2+ pitting edema to knees- new.  He is on Lasix 20 mg daily, denies history of CHF.  Denies dyspnea.  Likely new onset or undiagnosed CHF.  Likely undiagnosed CHF - Check pro-BNP-elevated  at 1002 - Chest x-ray - Echocardiogram -  will benefit from diuresis, hold off for now in the setting of severe sepsis, AKI  Diabetes mellitus- -hold Januvia and metformin . - HgbA1c - SSI- M  Hypertension-stable. - Resume nifedipine, lisinopril   Stroke with residual deficits-right-sided hemiparesis. - Resume atorvastatin, Plavix, and Eliquis  Chronic anticoagulation-he is on Xarelto.  He tells me it was started when he had a stroke.  Denies cardiac history or history of blood clots. -Resume Xarelto   History of peripheral artery disease, with stent placement - Resume Plavix, statins   DVT prophylaxis: XARELTO Code Status: full Family Communication: 2 sisters at bedside Disposition Plan: ~ 2days Consults called:  None. Possibly follow-up with urology as outpatient Admission status: Inpt Tele I certify that at the point of admission it is my clinical judgment that the patient will require inpatient hospital care spanning beyond 2 midnights from the point of admission due to high intensity of service, high risk for further deterioration and high frequency of surveillance required.    Author: Tully FORBES Carwin, MD 05/30/2024 7:09 PM  For on call review [email or web address].      [1] No Known Allergies

## 2024-05-31 ENCOUNTER — Inpatient Hospital Stay (HOSPITAL_COMMUNITY)

## 2024-05-31 ENCOUNTER — Encounter (HOSPITAL_COMMUNITY): Payer: Self-pay | Admitting: Internal Medicine

## 2024-05-31 DIAGNOSIS — R600 Localized edema: Secondary | ICD-10-CM

## 2024-05-31 LAB — BLOOD CULTURE ID PANEL (REFLEXED) - BCID2

## 2024-05-31 LAB — BASIC METABOLIC PANEL WITH GFR
Anion gap: 6 (ref 5–15)
BUN: 18 mg/dL (ref 8–23)
CO2: 28 mmol/L (ref 22–32)
Calcium: 8.8 mg/dL — ABNORMAL LOW (ref 8.9–10.3)
Chloride: 106 mmol/L (ref 98–111)
Creatinine, Ser: 1.41 mg/dL — ABNORMAL HIGH (ref 0.61–1.24)
GFR, Estimated: 55 mL/min — ABNORMAL LOW (ref >60)
Glucose, Bld: 148 mg/dL — ABNORMAL HIGH (ref 70–99)
Potassium: 4.5 mmol/L (ref 3.5–5.1)
Sodium: 140 mmol/L (ref 135–145)

## 2024-05-31 LAB — GLUCOSE, CAPILLARY
Glucose-Capillary: 170 mg/dL — ABNORMAL HIGH (ref 70–99)
Glucose-Capillary: 226 mg/dL — ABNORMAL HIGH (ref 70–99)
Glucose-Capillary: 190 mg/dL — ABNORMAL HIGH (ref 70–99)
Glucose-Capillary: 181 mg/dL — ABNORMAL HIGH (ref 70–99)

## 2024-05-31 LAB — CBC
HCT: 30.9 % — ABNORMAL LOW (ref 39.0–52.0)
Hemoglobin: 10 g/dL — ABNORMAL LOW (ref 13.0–17.0)
MCH: 31.1 pg (ref 26.0–34.0)
MCHC: 32.4 g/dL (ref 30.0–36.0)
MCV: 96 fL (ref 80.0–100.0)
Platelets: 334 K/uL (ref 150–400)
RBC: 3.22 MIL/uL — ABNORMAL LOW (ref 4.22–5.81)
RDW: 13.3 % (ref 11.5–15.5)
WBC: 15.7 K/uL — ABNORMAL HIGH (ref 4.0–10.5)
nRBC: 0 % (ref 0.0–0.2)

## 2024-05-31 LAB — ECHOCARDIOGRAM COMPLETE
AR max vel: 2.59 cm2
AV Area VTI: 3.09 cm2
AV Area mean vel: 2.99 cm2
AV Mean grad: 3.3 mmHg
AV Peak grad: 7.4 mmHg
Ao pk vel: 1.36 m/s
Area-P 1/2: 3.27 cm2
Height: 68 in
S' Lateral: 3.3 cm
Weight: 2480 [oz_av]

## 2024-05-31 LAB — HIV ANTIBODY (ROUTINE TESTING W REFLEX): HIV Screen 4th Generation wRfx: NONREACTIVE

## 2024-05-31 MED ORDER — GABAPENTIN 100 MG PO CAPS
200.0000 mg | ORAL_CAPSULE | Freq: Three times a day (TID) | ORAL | Status: DC
  Administered 2024-05-31 – 2024-06-03 (×10): 200 mg via ORAL
  Filled 2024-05-31 (×10): qty 2

## 2024-05-31 MED ADMIN — Acetaminophen Tab 325 MG: 650 mg | ORAL | NDC 00904677361

## 2024-05-31 MED ADMIN — Polyethylene Glycol 3350 Oral Packet 17 GM: 17 g | ORAL | NDC 62559015710

## 2024-05-31 NOTE — Progress Notes (Signed)
 Echocardiogram 2D Echocardiogram has been performed.  Merlynn Argyle 05/31/2024, 11:05 AM

## 2024-05-31 NOTE — Plan of Care (Signed)
 Problem: Education: Goal: Ability to describe self-care measures that may prevent or decrease complications (Diabetes Survival Skills Education) will improve 05/31/2024 0447 by Merilee Jeoffrey BROCKS, RN Outcome: Progressing 05/31/2024 0443 by Merilee Jeoffrey BROCKS, RN Outcome: Progressing Goal: Individualized Educational Video(s) 05/31/2024 0447 by Merilee Jeoffrey BROCKS, RN Outcome: Progressing 05/31/2024 0443 by Merilee Jeoffrey BROCKS, RN Outcome: Progressing   Problem: Coping: Goal: Ability to adjust to condition or change in health will improve 05/31/2024 0447 by Merilee Jeoffrey BROCKS, RN Outcome: Progressing 05/31/2024 0443 by Merilee Jeoffrey BROCKS, RN Outcome: Progressing   Problem: Fluid Volume: Goal: Ability to maintain a balanced intake and output will improve 05/31/2024 0447 by Merilee Jeoffrey BROCKS, RN Outcome: Progressing 05/31/2024 0443 by Merilee Jeoffrey BROCKS, RN Outcome: Progressing   Problem: Health Behavior/Discharge Planning: Goal: Ability to identify and utilize available resources and services will improve 05/31/2024 0447 by Merilee Jeoffrey BROCKS, RN Outcome: Progressing 05/31/2024 0443 by Merilee Jeoffrey BROCKS, RN Outcome: Progressing Goal: Ability to manage health-related needs will improve 05/31/2024 0447 by Merilee Jeoffrey BROCKS, RN Outcome: Progressing 05/31/2024 0443 by Merilee Jeoffrey BROCKS, RN Outcome: Progressing   Problem: Metabolic: Goal: Ability to maintain appropriate glucose levels will improve 05/31/2024 0447 by Merilee Jeoffrey BROCKS, RN Outcome: Progressing 05/31/2024 0443 by Merilee Jeoffrey BROCKS, RN Outcome: Progressing   Problem: Nutritional: Goal: Maintenance of adequate nutrition will improve 05/31/2024 0447 by Merilee Jeoffrey BROCKS, RN Outcome: Progressing 05/31/2024 0443 by Merilee Jeoffrey BROCKS, RN Outcome: Progressing Goal: Progress toward achieving an optimal weight will improve 05/31/2024 0447 by Merilee Jeoffrey BROCKS, RN Outcome: Progressing 05/31/2024 0443 by Merilee Jeoffrey BROCKS,  RN Outcome: Progressing   Problem: Skin Integrity: Goal: Risk for impaired skin integrity will decrease 05/31/2024 0447 by Merilee Jeoffrey BROCKS, RN Outcome: Progressing 05/31/2024 0443 by Merilee Jeoffrey BROCKS, RN Outcome: Progressing   Problem: Tissue Perfusion: Goal: Adequacy of tissue perfusion will improve 05/31/2024 0447 by Merilee Jeoffrey BROCKS, RN Outcome: Progressing 05/31/2024 0443 by Merilee Jeoffrey BROCKS, RN Outcome: Progressing   Problem: Education: Goal: Knowledge of General Education information will improve Description: Including pain rating scale, medication(s)/side effects and non-pharmacologic comfort measures 05/31/2024 0447 by Merilee Jeoffrey BROCKS, RN Outcome: Progressing 05/31/2024 0443 by Merilee Jeoffrey BROCKS, RN Outcome: Progressing   Problem: Health Behavior/Discharge Planning: Goal: Ability to manage health-related needs will improve 05/31/2024 0447 by Merilee Jeoffrey BROCKS, RN Outcome: Progressing 05/31/2024 0443 by Merilee Jeoffrey BROCKS, RN Outcome: Progressing   Problem: Clinical Measurements: Goal: Ability to maintain clinical measurements within normal limits will improve 05/31/2024 0447 by Merilee Jeoffrey BROCKS, RN Outcome: Progressing 05/31/2024 0443 by Merilee Jeoffrey BROCKS, RN Outcome: Progressing Goal: Will remain free from infection 05/31/2024 0447 by Merilee Jeoffrey BROCKS, RN Outcome: Progressing 05/31/2024 0443 by Merilee Jeoffrey BROCKS, RN Outcome: Progressing Goal: Diagnostic test results will improve 05/31/2024 0447 by Merilee Jeoffrey BROCKS, RN Outcome: Progressing 05/31/2024 0443 by Merilee Jeoffrey BROCKS, RN Outcome: Progressing Goal: Respiratory complications will improve 05/31/2024 0447 by Merilee Jeoffrey BROCKS, RN Outcome: Progressing 05/31/2024 0443 by Merilee Jeoffrey BROCKS, RN Outcome: Progressing Goal: Cardiovascular complication will be avoided 05/31/2024 0447 by Merilee Jeoffrey BROCKS, RN Outcome: Progressing 05/31/2024 0443 by Merilee Jeoffrey BROCKS, RN Outcome: Progressing    Problem: Activity: Goal: Risk for activity intolerance will decrease 05/31/2024 0447 by Merilee Jeoffrey BROCKS, RN Outcome: Progressing 05/31/2024 0443 by Merilee Jeoffrey BROCKS, RN Outcome: Progressing   Problem: Nutrition: Goal: Adequate nutrition will be maintained 05/31/2024 0447 by Merilee Jeoffrey BROCKS, RN Outcome: Progressing 05/31/2024 0443 by Merilee Jeoffrey BROCKS, RN Outcome: Progressing  Problem: Coping: Goal: Level of anxiety will decrease 05/31/2024 0447 by Merilee Jeoffrey BROCKS, RN Outcome: Progressing 05/31/2024 0443 by Merilee Jeoffrey BROCKS, RN Outcome: Progressing   Problem: Elimination: Goal: Will not experience complications related to bowel motility 05/31/2024 0447 by Merilee Jeoffrey BROCKS, RN Outcome: Progressing 05/31/2024 0443 by Merilee Jeoffrey BROCKS, RN Outcome: Progressing Goal: Will not experience complications related to urinary retention 05/31/2024 0447 by Merilee Jeoffrey BROCKS, RN Outcome: Progressing 05/31/2024 0443 by Merilee Jeoffrey BROCKS, RN Outcome: Progressing   Problem: Pain Managment: Goal: General experience of comfort will improve and/or be controlled 05/31/2024 0447 by Merilee Jeoffrey BROCKS, RN Outcome: Progressing 05/31/2024 0443 by Merilee Jeoffrey BROCKS, RN Outcome: Progressing   Problem: Safety: Goal: Ability to remain free from injury will improve 05/31/2024 0447 by Merilee Jeoffrey BROCKS, RN Outcome: Progressing 05/31/2024 0443 by Merilee Jeoffrey BROCKS, RN Outcome: Progressing   Problem: Skin Integrity: Goal: Risk for impaired skin integrity will decrease 05/31/2024 0447 by Merilee Jeoffrey BROCKS, RN Outcome: Progressing 05/31/2024 0443 by Merilee Jeoffrey BROCKS, RN Outcome: Progressing

## 2024-05-31 NOTE — Care Management Important Message (Signed)
 Important Message  Patient Details  Name: Jason Graham MRN: [id number] Date of Birth: January 09, 1961   Important Message Given:  Yes - Medicare IM     Temperance Kelemen L Ashleynicole Mcclees 05/31/2024, 2:34 PM

## 2024-05-31 NOTE — TOC CM/SW Note (Signed)
 Transition of Care [facility]) - Inpatient Brief Assessment   Patient Details  Name: Jason Graham MRN: [id number] Date of Birth: 01-03-1961  Transition of Care [facility]) CM/SW Contact:    Lucie Lunger, LCSWA Phone Number: 05/31/2024, 9:30 AM   Clinical Narrative: Transition of Care Department [facility]) has reviewed patient and no TOC needs have been identified at this time. We will continue to monitor patient advancement through interdiciplinary progression rounds. If new patient transition needs arise, please place a TOC consult.  Transition of Care Asessment: Insurance and Status: Insurance coverage has been reviewed Patient has primary care physician: Yes Home environment has been reviewed: From home Prior level of function:: Independent Prior/Current Home Services: No current home services Social Drivers of Health Review: SDOH reviewed no interventions necessary Readmission risk has been reviewed: Yes Transition of care needs: no transition of care needs at this time

## 2024-05-31 NOTE — Plan of Care (Signed)

## 2024-05-31 NOTE — Progress Notes (Signed)
 PROGRESS NOTE  Jason Graham, is a 63 y.o. male, DOB - Jan 14, 1961, FMW:[id number]  Admit date - 05/30/2024   Admitting Physician Tully FORBES Carwin, MD  Outpatient Primary MD for the patient is Waddell Eleanor CROME, MD  LOS - 1  Chief Complaint  Patient presents with   Leg Swelling      Brief Narrative:  63 y.o. male with medical history significant for stroke with residual right-sided deficits, diabetes mellitus, hypertension mated on 05/30/2024 with concerns for possible sepsis from urinary source and acute urinary retention requiring Foley catheter placement    -Assessment and Plan: 1) sepsis from urinary source--POA - Blood cultures with staph species probably contaminant await further data WBC 22.1 >>15.7 - Continue Rocephin pending further culture data  2) acute urinary retention/BPH--continue Flomax at increased dose of 0.8 mg daily along with Proscar  3)HFpEF--acute on chronic diastolic dysfunction CHF with worsening bilateral lower extremity edema - Echo from 2020 2024 with EF of 65 to 70% grade 1 diastolic dysfunction, no arctic stenosis, no mitral stenosis - Continue Coreg, hold off on Lasix for now due to #1 above  4)PAD--- with prior stent placement  --continue Xarelto at low-dose of 2.5 mg twice daily for PAD - Continue atorvastatin  5)H/o prior strokes with residual right-sided hemiparesis - Continue atorvastatin and Plavix for secondary stroke prophylaxis  6)HTN--stable, continue Procardia XL and Coreg  7)DM2- .  A1c 6.5 reflecting good diabetic control PTA - Metformin  and Januvia remains on hold Use Novolog /Humalog Sliding scale insulin  with Accu-Cheks/Fingersticks as ordered   8)AKI----acute kidney injury -   creatinine on admission=1.89  ,  baseline creatinine = no recent values available, previously normal apparently    ,  creatinine is now= 1.41  --renally adjust medications, avoid nephrotoxic agents / dehydration  / hypotension  Status is: Inpatient    Disposition: The patient is from: Home              Anticipated d/c is to: Home              Anticipated d/c date is: 2 days              Patient currently is not medically stable to d/c. Barriers: Not Clinically Stable-   Code Status :  -  Code Status: Full Code   Family Communication:   NA (patient is alert, awake and coherent)   DVT Prophylaxis  :   - SCDs    rivaroxaban (XARELTO) tablet 2.5 mg   Lab Results  Component Value Date   PLT 334 05/31/2024    Inpatient Medications  Scheduled Meds:  atorvastatin  80 mg Oral Daily   carvedilol  6.25 mg Oral BID   Chlorhexidine  Gluconate Cloth  6 each Topical Daily   clopidogrel  75 mg Oral Daily   finasteride  5 mg Oral Daily   gabapentin  200 mg Oral TID   insulin  aspart  0-15 Units Subcutaneous TID WC   insulin  aspart  0-5 Units Subcutaneous QHS   NIFEdipine  90 mg Oral Daily   rivaroxaban  2.5 mg Oral BID   tamsulosin  0.8 mg Oral Daily   Continuous Infusions:  cefTRIAXone (ROCEPHIN)  IV 2 g (05/31/24 1606)   PRN Meds:.acetaminophen  **OR** acetaminophen , ondansetron  **OR** ondansetron  (ZOFRAN ) IV, polyethylene glycol   Anti-infectives (From admission, onward)    Start     Dose/Rate Route Frequency Ordered Stop   05/31/24 1500  cefTRIAXone (ROCEPHIN) 2 g in sodium chloride  0.9 % 100  mL IVPB        2 g 200 mL/hr over 30 Minutes Intravenous Every 24 hours 05/30/24 2003     05/30/24 1445  cefTRIAXone (ROCEPHIN) 2 g in sodium chloride  0.9 % 100 mL IVPB        2 g 200 mL/hr over 30 Minutes Intravenous  Once 05/30/24 1443 05/30/24 1526         Subjective: [name] today has no fevers, no emesis,  No chest pain,   - Dyspnea improved  Objective: Vitals:   05/30/24 2004 05/30/24 2355 05/31/24 0356 05/31/24 0947  BP:  (!) 114/58 114/63 108/61  Pulse:  88 77 79  Resp:  19 16 18   Temp:  99.8 F (37.7 C) 98.9 F (37.2 C) 98.2 F (36.8 C)  TempSrc:  Oral Oral Oral  SpO2: 96% 96% 94% 92%  Weight:       Height:        Intake/Output Summary (Last 24 hours) at 05/31/2024 1906 Last data filed at 05/31/2024 1800 Gross per 24 hour  Intake 960 ml  Output 1450 ml  Net -490 ml   Filed Weights   05/30/24 1310  Weight: 70.3 kg    Physical Exam Gen:- Awake Alert, no acute distress HEENT:- [place].AT, No sclera icterus Neck-Supple Neck,No JVD,.  Lungs-improving air movement, no wheezing  CV- S1, S2 normal, regular  Abd-  +ve B.Sounds, Abd Soft, No tenderness,    Extremity/Skin:-Improving edema, pedal pulses present  Psych-affect is appropriate, oriented x3 Neuro-no new focal deficits, no tremors GU--Foley in situ with clear urine  Data Reviewed: I have personally reviewed following labs and imaging studies  CBC: Recent Labs  Lab 05/30/24 1322 05/31/24 0618  WBC 22.1* 15.7*  NEUTROABS 19.5*  --   HGB 12.3* 10.0*  HCT 38.1* 30.9*  MCV 94.8 96.0  PLT 499* 334   Basic Metabolic Panel: Recent Labs  Lab 05/30/24 1322 05/31/24 0618  NA 139 140  K 5.1 4.5  CL 103 106  CO2 21* 28  GLUCOSE 223* 148*  BUN 23 18  CREATININE 1.89* 1.41*  CALCIUM 10.2 8.8*   GFR: Estimated Creatinine Clearance: 50.5 mL/min (A) (by C-G formula based on SCr of 1.41 mg/dL (H)). Liver Function Tests: Recent Labs  Lab 05/30/24 1322  AST 17  ALT 11  ALKPHOS 91  BILITOT 0.3  PROT 7.4  ALBUMIN  4.2   BNP (last 3 results) Recent Labs    05/30/24 1322  PROBNP 1,002.0*   HbA1C: Recent Labs    05/31/24 0500  HGBA1C 6.5*   Recent Results (from the past 240 hours)  Culture, blood (routine x 2)     Status: None (Preliminary result)   Collection Time: 05/30/24  1:22 PM   Specimen: BLOOD  Result Value Ref Range Status   Specimen Description BLOOD LEFT ASSIST CONTROL  Final   Special Requests   Final    BOTTLES DRAWN AEROBIC AND ANAEROBIC Blood Culture adequate volume   Culture   Final    NO GROWTH < 24 HOURS Performed at [facility], [address] [place], [place] [address]    Report  Status PENDING  Incomplete  Culture, blood (routine x 2)     Status: None (Preliminary result)   Collection Time: 05/30/24  2:24 PM   Specimen: BLOOD  Result Value Ref Range Status   Specimen Description   Final    BLOOD BLOOD RIGHT ARM AEROBIC BOTTLE ONLY Performed at [facility], [address]  [place], [place] [address]    Special Requests   Final    Blood Culture results may not be optimal due to an inadequate volume of blood received in culture bottles Performed at [facility], [address], [place], [place] [address]    Culture  Setup Time   Final    GRAM POSITIVE COCCI AEROBIC BOTTLE ONLY Gram Stain Report Called to,Read Back By and Verified With: MADISON @ 1354 ON [id number] BY HENDERSON L CRITICAL RESULT CALLED TO, READ BACK BY AND VERIFIED WITH: RN JUDITHANN DIXON [phone] @ 1800 FH Performed at [facility], [address] [place], [place] [address]    Culture GRAM POSITIVE COCCI  Final   Report Status PENDING  Incomplete  Blood Culture ID Panel (Reflexed)     Status: Abnormal   Collection Time: 05/30/24  2:24 PM  Result Value Ref Range Status   Enterococcus faecalis NOT DETECTED NOT DETECTED Final   Enterococcus Faecium NOT DETECTED NOT DETECTED Final   Listeria monocytogenes NOT DETECTED NOT DETECTED Final   Staphylococcus species DETECTED (A) NOT DETECTED Final    Comment: CRITICAL RESULT CALLED TO, READ BACK BY AND VERIFIED WITH: RN G. MADISON [phone] @ 1800 FH    Staphylococcus aureus (BCID) NOT DETECTED NOT DETECTED Final   Staphylococcus epidermidis NOT DETECTED NOT DETECTED Final   Staphylococcus lugdunensis NOT DETECTED NOT DETECTED Final   Streptococcus species NOT DETECTED NOT DETECTED Final   Streptococcus agalactiae NOT DETECTED NOT DETECTED Final   Streptococcus pneumoniae NOT DETECTED NOT DETECTED Final   Streptococcus pyogenes NOT DETECTED NOT DETECTED Final   A.calcoaceticus-baumannii NOT DETECTED NOT DETECTED Final   Bacteroides fragilis NOT DETECTED  NOT DETECTED Final   Enterobacterales NOT DETECTED NOT DETECTED Final   Enterobacter cloacae complex NOT DETECTED NOT DETECTED Final   Escherichia coli NOT DETECTED NOT DETECTED Final   Klebsiella aerogenes NOT DETECTED NOT DETECTED Final   Klebsiella oxytoca NOT DETECTED NOT DETECTED Final   Klebsiella pneumoniae NOT DETECTED NOT DETECTED Final   Proteus species NOT DETECTED NOT DETECTED Final   Salmonella species NOT DETECTED NOT DETECTED Final   Serratia marcescens NOT DETECTED NOT DETECTED Final   Haemophilus influenzae NOT DETECTED NOT DETECTED Final   Neisseria meningitidis NOT DETECTED NOT DETECTED Final   Pseudomonas aeruginosa NOT DETECTED NOT DETECTED Final   Stenotrophomonas maltophilia NOT DETECTED NOT DETECTED Final   Candida albicans NOT DETECTED NOT DETECTED Final   Candida auris NOT DETECTED NOT DETECTED Final   Candida glabrata NOT DETECTED NOT DETECTED Final   Candida krusei NOT DETECTED NOT DETECTED Final   Candida parapsilosis NOT DETECTED NOT DETECTED Final   Candida tropicalis NOT DETECTED NOT DETECTED Final   Cryptococcus neoformans/gattii NOT DETECTED NOT DETECTED Final    Comment: Performed at [facility], [address] [place], [place] [address]    Radiology Studies: ECHOCARDIOGRAM COMPLETE Result Date: 05/31/2024    ECHOCARDIOGRAM REPORT   Patient Name:   CHRISTINE SCHIEFELBEIN Date of Exam: 05/31/2024 Medical Rec #:  [id number]   Height:       68.0 in Accession #:    [id number]  Weight:       155.0 lb Date of Birth:  08-Jun-1961    BSA:          1.834 m Patient Age:    63 years    BP:           108/61 mmHg Patient Gender: M  HR:           79 bpm. Exam Location:  [facility] Procedure: 2D Echo, Cardiac Doppler and Color Doppler (Both Spectral and Color            Flow Doppler were utilized during procedure). Indications:    Bilateral leg edema  History:        Patient has no prior history of Echocardiogram examinations.                 PAD; Risk  Factors:Hypertension, Diabetes and Current Smoker.  Sonographer:    Merlynn Argyle Referring Phys: [phone] EJIROGHENE E Sheneika Walstad IMPRESSIONS  1. Left ventricular ejection fraction, by estimation, is 65 to 70%. The left ventricle has normal function. The left ventricle has no regional wall motion abnormalities. There is mild left ventricular hypertrophy. Left ventricular diastolic parameters are consistent with Grade I diastolic dysfunction (impaired relaxation).  2. Right ventricular systolic function is normal. The right ventricular size is normal.  3. The mitral valve is normal in structure. No evidence of mitral valve regurgitation. No evidence of mitral stenosis.  4. The aortic valve is tricuspid. Aortic valve regurgitation is not visualized. No aortic stenosis is present.  5. The inferior vena cava is normal in size with greater than 50% respiratory variability, suggesting right atrial pressure of 3 mmHg. FINDINGS  Left Ventricle: Left ventricular ejection fraction, by estimation, is 65 to 70%. The left ventricle has normal function. The left ventricle has no regional wall motion abnormalities. The left ventricular internal cavity size was normal in size. There is  mild left ventricular hypertrophy. Left ventricular diastolic parameters are consistent with Grade I diastolic dysfunction (impaired relaxation). Right Ventricle: The right ventricular size is normal. Right vetricular wall thickness was not well visualized. Right ventricular systolic function is normal. Left Atrium: Left atrial size was normal in size. Right Atrium: Right atrial size was normal in size. Pericardium: There is no evidence of pericardial effusion. Mitral Valve: The mitral valve is normal in structure. No evidence of mitral valve regurgitation. No evidence of mitral valve stenosis. Tricuspid Valve: The tricuspid valve is normal in structure. Tricuspid valve regurgitation is not demonstrated. No evidence of tricuspid stenosis. Aortic Valve: The  aortic valve is tricuspid. Aortic valve regurgitation is not visualized. No aortic stenosis is present. Aortic valve mean gradient measures 3.3 mmHg. Aortic valve peak gradient measures 7.4 mmHg. Aortic valve area, by VTI measures 3.09 cm. Pulmonic Valve: The pulmonic valve was not well visualized. Pulmonic valve regurgitation is not visualized. No evidence of pulmonic stenosis. Aorta: The aortic root and ascending aorta are structurally normal, with no evidence of dilitation. Venous: The inferior vena cava is normal in size with greater than 50% respiratory variability, suggesting right atrial pressure of 3 mmHg. IAS/Shunts: The interatrial septum was not well visualized.  LEFT VENTRICLE PLAX 2D LVIDd:         4.70 cm   Diastology LVIDs:         3.30 cm   LV e' medial:    5.87 cm/s LV PW:         1.20 cm   LV E/e' medial:  13.4 LV IVS:        1.20 cm   LV e' lateral:   7.29 cm/s LVOT diam:     2.20 cm   LV E/e' lateral: 10.8 LV SV:         73 LV SV Index:   40 LVOT Area:  3.80 cm LV IVRT:       67 msec  RIGHT VENTRICLE             IVC RV Basal diam:  3.70 cm     IVC diam: 1.90 cm RV S prime:     13.90 cm/s TAPSE (M-mode): 2.1 cm LEFT ATRIUM             Index        RIGHT ATRIUM           Index LA diam:        4.20 cm 2.29 cm/m   RA Area:     13.20 cm LA Vol (A2C):   52.2 ml 28.46 ml/m  RA Volume:   25.30 ml  13.80 ml/m LA Vol (A4C):   51.6 ml 28.14 ml/m LA Biplane Vol: 54.0 ml 29.45 ml/m  AORTIC VALVE                    PULMONIC VALVE AV Area (Vmax):    2.59 cm     RVOT Peak grad: 5 mmHg AV Area (Vmean):   2.99 cm AV Area (VTI):     3.09 cm AV Vmax:           136.36 cm/s AV Vmean:          82.773 cm/s AV VTI:            0.236 m AV Peak Grad:      7.4 mmHg AV Mean Grad:      3.3 mmHg LVOT Vmax:         92.90 cm/s LVOT Vmean:        65.200 cm/s LVOT VTI:          0.192 m LVOT/AV VTI ratio: 0.81  AORTA Ao Root diam: 3.60 cm Ao Asc diam:  3.20 cm MITRAL VALVE MV Area (PHT): 3.27 cm     SHUNTS MV Decel  Time: 232 msec     Systemic VTI:  0.19 m MV E velocity: 78.80 cm/s   Systemic Diam: 2.20 cm MV A velocity: 106.00 cm/s  Pulmonic VTI:  0.193 m MV E/A ratio:  0.74 Dorn Ross MD Electronically signed by Dorn Ross MD Signature Date/Time: 05/31/2024/3:39:32 PM    Final    DG CHEST PORT 1 VIEW Result Date: 05/30/2024 CLINICAL DATA:  Sepsis EXAM: PORTABLE CHEST 1 VIEW COMPARISON:  04/07/2013 FINDINGS: Borderline to mild cardiomegaly. Low lung volume. Aortic atherosclerosis. No acute airspace disease, pleural effusion or pneumothorax IMPRESSION: No active disease. Borderline to mild cardiomegaly. Electronically Signed   By: Luke Bun M.D.   On: 05/30/2024 19:09   Scheduled Meds:  atorvastatin  80 mg Oral Daily   carvedilol  6.25 mg Oral BID   Chlorhexidine  Gluconate Cloth  6 each Topical Daily   clopidogrel  75 mg Oral Daily   finasteride  5 mg Oral Daily   gabapentin  200 mg Oral TID   insulin  aspart  0-15 Units Subcutaneous TID WC   insulin  aspart  0-5 Units Subcutaneous QHS   NIFEdipine  90 mg Oral Daily   rivaroxaban  2.5 mg Oral BID   tamsulosin  0.8 mg Oral Daily   Continuous Infusions:  cefTRIAXone (ROCEPHIN)  IV 2 g (05/31/24 1606)    LOS: 1 day   Rendall Carwin M.D on 05/31/2024 at 7:06 PM  Go to [email or web address] - for contact info  [facility] - Office  [phone]  If 7PM-7AM, please contact night-coverage  [email or web address] 05/31/2024, 7:06 PM

## 2024-06-01 LAB — CULTURE, BLOOD (ROUTINE X 2)

## 2024-06-01 LAB — GLUCOSE, CAPILLARY
Glucose-Capillary: 164 mg/dL — ABNORMAL HIGH (ref 70–99)
Glucose-Capillary: 178 mg/dL — ABNORMAL HIGH (ref 70–99)
Glucose-Capillary: 212 mg/dL — ABNORMAL HIGH (ref 70–99)
Glucose-Capillary: 198 mg/dL — ABNORMAL HIGH (ref 70–99)

## 2024-06-01 MED ORDER — HYDROCODONE-ACETAMINOPHEN 5-325 MG PO TABS: 1.0000 | ORAL_TABLET | Freq: Every evening | ORAL | Status: DC | PRN

## 2024-06-01 MED ORDER — BISACODYL 10 MG RE SUPP
10.0000 mg | Freq: Once | RECTAL | Status: AC
  Administered 2024-06-01: 10 mg via RECTAL
  Filled 2024-06-01: qty 1

## 2024-06-01 MED ORDER — HYDROCODONE-ACETAMINOPHEN 5-325 MG PO TABS
1.0000 | ORAL_TABLET | Freq: Two times a day (BID) | ORAL | Status: DC
  Administered 2024-06-01 – 2024-06-03 (×5): 1 via ORAL
  Filled 2024-06-01 (×6): qty 1

## 2024-06-01 MED ORDER — FUROSEMIDE 20 MG PO TABS
20.0000 mg | ORAL_TABLET | Freq: Every day | ORAL | Status: DC
  Administered 2024-06-01 – 2024-06-03 (×3): 20 mg via ORAL
  Filled 2024-06-01 (×3): qty 1

## 2024-06-01 MED ORDER — HYDROCODONE-ACETAMINOPHEN 5-325 MG PO TABS: 1.0000 | ORAL_TABLET | Freq: Two times a day (BID) | ORAL | Status: DC

## 2024-06-01 MED ORDER — SENNOSIDES-DOCUSATE SODIUM 8.6-50 MG PO TABS
2.0000 | ORAL_TABLET | Freq: Every day | ORAL | Status: DC
  Administered 2024-06-01 – 2024-06-02 (×2): 2 via ORAL
  Filled 2024-06-01 (×2): qty 2

## 2024-06-01 MED ORDER — POLYETHYLENE GLYCOL 3350 17 G PO PACK
17.0000 g | PACK | Freq: Once | ORAL | Status: AC
  Administered 2024-06-01: 17 g via ORAL
  Filled 2024-06-01: qty 1

## 2024-06-01 NOTE — Plan of Care (Signed)

## 2024-06-01 NOTE — Progress Notes (Addendum)
 PROGRESS NOTE  Bleu Minerd, is a 63 y.o. male, DOB - 09-01-1960, FMW:[id number]  Admit date - 05/30/2024   Admitting Physician Tully FORBES Carwin, MD  Outpatient Primary MD for the patient is Waddell Eleanor CROME, MD  LOS - 2  Chief Complaint  Patient presents with   Leg Swelling      Brief Narrative:  63 y.o. male with medical history significant for stroke with residual right-sided deficits, diabetes mellitus, hypertension mated on 05/30/2024 with GNR sepsis from urinary source and acute urinary retention requiring Foley catheter placement    -Assessment and Plan: 1)Sepsis from urinary source--POA - Blood cultures from 05/30/24 with staph species probably contaminant await further data Urine Cx from 05/30/24 with GNR, sensitive pending WBC 22.1 >>15.7 - Continue Rocephin  pending further culture data  2)Acute urinary retention/BPH--continue Flomax  at increased dose of 0.8 mg daily along with Proscar  -- Continue Foley in situ - Patient will need to keep Foley in at discharge and follow-up urology as outpatient for postvoid trial  3)HFpEF--acute on chronic diastolic dysfunction CHF with bilateral lower extremity edema - Echo from 2020 2024 with EF of 65 to 70% grade 1 diastolic dysfunction, no arctic stenosis, no mitral stenosis - Give Lasix  20 mg daily--first dose 06/01/2024  4)PAD--- with prior stent placement  --continue Xarelto  at low-dose of 2.5 mg twice daily for PAD - Continue atorvastatin   5)H/o prior strokes with residual right-sided hemiparesis - Continue atorvastatin  and Plavix  for secondary stroke prophylaxis  6)HTN--stable, continue Procardia  XL and Coreg   7)DM2- .  A1c 6.5 reflecting good diabetic control PTA - Metformin  and Januvia  remains on hold Use Novolog /Humalog Sliding scale insulin  with Accu-Cheks/Fingersticks as ordered   8)AKI----acute kidney injury -   creatinine on admission=1.89  ,  baseline creatinine = no recent values available, previously  normal apparently    ,  creatinine is now= 1.41  -Monitor renal function closely with initiation of Lasix  --renally adjust medications, avoid nephrotoxic agents / dehydration  / hypotension  9) generalized weakness/deconditioning--physical therapy eval requested  10)Constipation--no BM in the last 8 days or so-laxatives ordered   Status is: Inpatient   Disposition: The patient is from: Home              Anticipated d/c is to: Home              Anticipated d/c date is: 1 day              Patient currently is not medically stable to d/c. Barriers: Not Clinically Stable-   Code Status :  -  Code Status: Full Code   Family Communication:   NA (patient is alert, awake and coherent)   At pt's request called and updated patient's sister Ms Niels Cable [phone]  DVT Prophylaxis  :   - SCDs    rivaroxaban  (XARELTO ) tablet 2.5 mg   Lab Results  Component Value Date   PLT 334 05/31/2024   Inpatient Medications  Scheduled Meds:  atorvastatin   80 mg Oral Daily   carvedilol   6.25 mg Oral BID   Chlorhexidine  Gluconate Cloth  6 each Topical Daily   clopidogrel   75 mg Oral Daily   finasteride   5 mg Oral Daily   gabapentin   200 mg Oral TID   insulin  aspart  0-15 Units Subcutaneous TID WC   insulin  aspart  0-5 Units Subcutaneous QHS   NIFEdipine   90 mg Oral Daily   rivaroxaban   2.5 mg Oral BID   tamsulosin   0.8 mg  Oral Daily   Continuous Infusions:  cefTRIAXone  (ROCEPHIN )  IV Stopped (05/31/24 1636)   PRN Meds:.acetaminophen  **OR** acetaminophen , ondansetron  **OR** ondansetron  (ZOFRAN ) IV, polyethylene glycol   Anti-infectives (From admission, onward)    Start     Dose/Rate Route Frequency Ordered Stop   05/31/24 1500  cefTRIAXone  (ROCEPHIN ) 2 g in sodium chloride  0.9 % 100 mL IVPB        2 g 200 mL/hr over 30 Minutes Intravenous Every 24 hours 05/30/24 2003     05/30/24 1445  cefTRIAXone  (ROCEPHIN ) 2 g in sodium chloride  0.9 % 100 mL IVPB        2 g 200 mL/hr over 30  Minutes Intravenous  Once 05/30/24 1443 05/30/24 1526      Subjective: [name] today has no fevers, no emesis,  No chest pain,   - Complains of fatigue and generalized weakness - Foley with good urine output -Complains of constipation, no BM in at least 8 days  At pt's request called and updated patient's sister Ms Niels Cable [phone]  Objective: Vitals:   05/31/24 0947 05/31/24 2057 06/01/24 0521 06/01/24 0843  BP: 108/61 (!) 110/57 106/63 116/69  Pulse: 79 85 75   Resp: 18 15 18    Temp: 98.2 F (36.8 C) 99 F (37.2 C) 98 F (36.7 C)   TempSrc: Oral Oral Oral   SpO2: 92% 95% 96% 95%  Weight:      Height:        Intake/Output Summary (Last 24 hours) at 06/01/2024 1109 Last data filed at 06/01/2024 0517 Gross per 24 hour  Intake 820 ml  Output 2050 ml  Net -1230 ml   Filed Weights   05/30/24 1310  Weight: 70.3 kg    Physical Exam Gen:- Awake Alert, no acute distress HEENT:- [place].AT, No sclera icterus Neck-Supple Neck,No JVD,.  Lungs-improving air movement, no wheezing  CV- S1, S2 normal, regular  Abd-  +ve B.Sounds, Abd Soft, No tenderness,    Extremity/Skin:-Improving edema, pedal pulses present  Psych-affect is appropriate, oriented x3 Neuro-no new focal deficits, no tremors GU--Foley in situ with clear urine  Data Reviewed: I have personally reviewed following labs and imaging studies  CBC: Recent Labs  Lab 05/30/24 1322 05/31/24 0618  WBC 22.1* 15.7*  NEUTROABS 19.5*  --   HGB 12.3* 10.0*  HCT 38.1* 30.9*  MCV 94.8 96.0  PLT 499* 334   Basic Metabolic Panel: Recent Labs  Lab 05/30/24 1322 05/31/24 0618  NA 139 140  K 5.1 4.5  CL 103 106  CO2 21* 28  GLUCOSE 223* 148*  BUN 23 18  CREATININE 1.89* 1.41*  CALCIUM  10.2 8.8*   GFR: Estimated Creatinine Clearance: 50.5 mL/min (A) (by C-G formula based on SCr of 1.41 mg/dL (H)). Liver Function Tests: Recent Labs  Lab 05/30/24 1322  AST 17  ALT 11  ALKPHOS 91  BILITOT 0.3   PROT 7.4  ALBUMIN  4.2   BNP (last 3 results) Recent Labs    05/30/24 1322  PROBNP 1,002.0*   HbA1C: Recent Labs    05/31/24 0500  HGBA1C 6.5*   Recent Results (from the past 240 hours)  Culture, blood (routine x 2)     Status: None (Preliminary result)   Collection Time: 05/30/24  1:22 PM   Specimen: BLOOD  Result Value Ref Range Status   Specimen Description BLOOD LEFT ASSIST CONTROL  Final   Special Requests   Final    BOTTLES DRAWN AEROBIC AND ANAEROBIC Blood Culture adequate  volume   Culture   Final    NO GROWTH 2 DAYS Performed at [facility], [address], [place], [place] [address]    Report Status PENDING  Incomplete  Urine Culture     Status: Abnormal (Preliminary result)   Collection Time: 05/30/24  1:40 PM   Specimen: Urine, Random  Result Value Ref Range Status   Specimen Description   Final    URINE, RANDOM Performed at [facility], [address] [place], [place] [address]    Special Requests   Final    NONE Reflexed from [id number] Performed at [facility], [address] [place], [place] [address]    Culture >=100,000 COLONIES/mL GRAM NEGATIVE RODS (A)  Final   Report Status PENDING  Incomplete  Culture, blood (routine x 2)     Status: None (Preliminary result)   Collection Time: 05/30/24  2:24 PM   Specimen: BLOOD  Result Value Ref Range Status   Specimen Description   Final    BLOOD BLOOD RIGHT ARM AEROBIC BOTTLE ONLY Performed at [facility], [address] [place], [place] [address]    Special Requests   Final    Blood Culture results may not be optimal due to an inadequate volume of blood received in culture bottles Performed at [facility], [address] [place], [place] [address]    Culture  Setup Time   Final    GRAM POSITIVE COCCI AEROBIC BOTTLE ONLY Gram Stain Report Called to,Read Back By and Verified With: MADISON @ 1354 ON [id number] BY HENDERSON L CRITICAL RESULT CALLED TO, READ BACK BY AND VERIFIED WITH: RN G. MADISON [id number] @  1800 FH    Culture   Final    GRAM POSITIVE COCCI IDENTIFICATION TO FOLLOW Performed at [facility], [address], [place], [place] [address]    Report Status PENDING  Incomplete  Blood Culture ID Panel (Reflexed)     Status: Abnormal   Collection Time: 05/30/24  2:24 PM  Result Value Ref Range Status   Enterococcus faecalis NOT DETECTED NOT DETECTED Final   Enterococcus Faecium NOT DETECTED NOT DETECTED Final   Listeria monocytogenes NOT DETECTED NOT DETECTED Final   Staphylococcus species DETECTED (A) NOT DETECTED Final    Comment: CRITICAL RESULT CALLED TO, READ BACK BY AND VERIFIED WITH: RN G. MADISON [id number] @ 1800 FH    Staphylococcus aureus (BCID) NOT DETECTED NOT DETECTED Final   Staphylococcus epidermidis NOT DETECTED NOT DETECTED Final   Staphylococcus lugdunensis NOT DETECTED NOT DETECTED Final   Streptococcus species NOT DETECTED NOT DETECTED Final   Streptococcus agalactiae NOT DETECTED NOT DETECTED Final   Streptococcus pneumoniae NOT DETECTED NOT DETECTED Final   Streptococcus pyogenes NOT DETECTED NOT DETECTED Final   A.calcoaceticus-baumannii NOT DETECTED NOT DETECTED Final   Bacteroides fragilis NOT DETECTED NOT DETECTED Final   Enterobacterales NOT DETECTED NOT DETECTED Final   Enterobacter cloacae complex NOT DETECTED NOT DETECTED Final   Escherichia coli NOT DETECTED NOT DETECTED Final   Klebsiella aerogenes NOT DETECTED NOT DETECTED Final   Klebsiella oxytoca NOT DETECTED NOT DETECTED Final   Klebsiella pneumoniae NOT DETECTED NOT DETECTED Final   Proteus species NOT DETECTED NOT DETECTED Final   Salmonella species NOT DETECTED NOT DETECTED Final   Serratia marcescens NOT DETECTED NOT DETECTED Final   Haemophilus influenzae NOT DETECTED NOT DETECTED Final   Neisseria meningitidis NOT DETECTED NOT DETECTED Final   Pseudomonas aeruginosa NOT DETECTED NOT DETECTED Final   Stenotrophomonas maltophilia  NOT DETECTED NOT DETECTED Final   Candida albicans  NOT DETECTED NOT DETECTED Final   Candida auris NOT DETECTED NOT DETECTED Final   Candida glabrata NOT DETECTED NOT DETECTED Final   Candida krusei NOT DETECTED NOT DETECTED Final   Candida parapsilosis NOT DETECTED NOT DETECTED Final   Candida tropicalis NOT DETECTED NOT DETECTED Final   Cryptococcus neoformans/gattii NOT DETECTED NOT DETECTED Final    Comment: Performed at [facility], [address] [place], [place] [address]    Radiology Studies: US  RENAL Result Date: 05/31/2024 EXAM: US  Retroperitoneum Complete, Renal. 05/31/2024 09:26:36 AM TECHNIQUE: Real-time ultrasonography of the retroperitoneum renal was performed. COMPARISON: None available CLINICAL HISTORY: AKI (acute kidney injury). FINDINGS: FINDINGS: RIGHT KIDNEY/URETER: Right kidney measures 11.2 x 5.2 x 5.5 cm (168 mL). Normal cortical echogenicity. No hydronephrosis. No calculus. No mass. LEFT KIDNEY/URETER: Left kidney measures 11.6 x 6.6 x 5.4 cm (216 mL). Normal cortical echogenicity. Moderate left hydronephrosis. No calculus. No mass. BLADDER: Bladder has a thick wall but is decompressed by an indwelling foley catheter. IMPRESSION: 1. Moderate left hydronephrosis. 2. Bladder decompressed by an indwelling Foley catheter. Electronically signed by: Pinkie Pebbles MD 05/31/2024 08:08 PM EST RP Workstation: [id number]   ECHOCARDIOGRAM COMPLETE Result Date: 05/31/2024    ECHOCARDIOGRAM REPORT   Patient Name:   Jason Graham Date of Exam: 05/31/2024 Medical Rec #:  [id number]   Height:       68.0 in Accession #:    [id number]  Weight:       155.0 lb Date of Birth:  1961/01/11    BSA:          1.834 m Patient Age:    63 years    BP:           108/61 mmHg Patient Gender: M           HR:           79 bpm. Exam Location:  [facility] Procedure: 2D Echo, Cardiac Doppler and Color Doppler (Both Spectral and Color            Flow Doppler were utilized during procedure). Indications:    Bilateral leg edema  History:        Patient has no  prior history of Echocardiogram examinations.                 PAD; Risk Factors:Hypertension, Diabetes and Current Smoker.  Sonographer:    Merlynn Argyle Referring Phys: [phone] EJIROGHENE E Evelio Rueda IMPRESSIONS  1. Left ventricular ejection fraction, by estimation, is 65 to 70%. The left ventricle has normal function. The left ventricle has no regional wall motion abnormalities. There is mild left ventricular hypertrophy. Left ventricular diastolic parameters are consistent with Grade I diastolic dysfunction (impaired relaxation).  2. Right ventricular systolic function is normal. The right ventricular size is normal.  3. The mitral valve is normal in structure. No evidence of mitral valve regurgitation. No evidence of mitral stenosis.  4. The aortic valve is tricuspid. Aortic valve regurgitation is not visualized. No aortic stenosis is present.  5. The inferior vena cava is normal in size with greater than 50% respiratory variability, suggesting right atrial pressure of 3 mmHg. FINDINGS  Left Ventricle: Left ventricular ejection fraction, by estimation, is 65 to 70%. The left ventricle has normal function. The left ventricle has no regional wall motion abnormalities. The left ventricular internal cavity size was normal in size. There is  mild left ventricular hypertrophy. Left ventricular diastolic  parameters are consistent with Grade I diastolic dysfunction (impaired relaxation). Right Ventricle: The right ventricular size is normal. Right vetricular wall thickness was not well visualized. Right ventricular systolic function is normal. Left Atrium: Left atrial size was normal in size. Right Atrium: Right atrial size was normal in size. Pericardium: There is no evidence of pericardial effusion. Mitral Valve: The mitral valve is normal in structure. No evidence of mitral valve regurgitation. No evidence of mitral valve stenosis. Tricuspid Valve: The tricuspid valve is normal in structure. Tricuspid valve regurgitation is  not demonstrated. No evidence of tricuspid stenosis. Aortic Valve: The aortic valve is tricuspid. Aortic valve regurgitation is not visualized. No aortic stenosis is present. Aortic valve mean gradient measures 3.3 mmHg. Aortic valve peak gradient measures 7.4 mmHg. Aortic valve area, by VTI measures 3.09 cm. Pulmonic Valve: The pulmonic valve was not well visualized. Pulmonic valve regurgitation is not visualized. No evidence of pulmonic stenosis. Aorta: The aortic root and ascending aorta are structurally normal, with no evidence of dilitation. Venous: The inferior vena cava is normal in size with greater than 50% respiratory variability, suggesting right atrial pressure of 3 mmHg. IAS/Shunts: The interatrial septum was not well visualized.  LEFT VENTRICLE PLAX 2D LVIDd:         4.70 cm   Diastology LVIDs:         3.30 cm   LV e' medial:    5.87 cm/s LV PW:         1.20 cm   LV E/e' medial:  13.4 LV IVS:        1.20 cm   LV e' lateral:   7.29 cm/s LVOT diam:     2.20 cm   LV E/e' lateral: 10.8 LV SV:         73 LV SV Index:   40 LVOT Area:     3.80 cm LV IVRT:       67 msec  RIGHT VENTRICLE             IVC RV Basal diam:  3.70 cm     IVC diam: 1.90 cm RV S prime:     13.90 cm/s TAPSE (M-mode): 2.1 cm LEFT ATRIUM             Index        RIGHT ATRIUM           Index LA diam:        4.20 cm 2.29 cm/m   RA Area:     13.20 cm LA Vol (A2C):   52.2 ml 28.46 ml/m  RA Volume:   25.30 ml  13.80 ml/m LA Vol (A4C):   51.6 ml 28.14 ml/m LA Biplane Vol: 54.0 ml 29.45 ml/m  AORTIC VALVE                    PULMONIC VALVE AV Area (Vmax):    2.59 cm     RVOT Peak grad: 5 mmHg AV Area (Vmean):   2.99 cm AV Area (VTI):     3.09 cm AV Vmax:           136.36 cm/s AV Vmean:          82.773 cm/s AV VTI:            0.236 m AV Peak Grad:      7.4 mmHg AV Mean Grad:      3.3 mmHg LVOT Vmax:         92.90 cm/s  LVOT Vmean:        65.200 cm/s LVOT VTI:          0.192 m LVOT/AV VTI ratio: 0.81  AORTA Ao Root diam: 3.60 cm Ao Asc  diam:  3.20 cm MITRAL VALVE MV Area (PHT): 3.27 cm     SHUNTS MV Decel Time: 232 msec     Systemic VTI:  0.19 m MV E velocity: 78.80 cm/s   Systemic Diam: 2.20 cm MV A velocity: 106.00 cm/s  Pulmonic VTI:  0.193 m MV E/A ratio:  0.74 Dorn Ross MD Electronically signed by Dorn Ross MD Signature Date/Time: 05/31/2024/3:39:32 PM    Final    DG CHEST PORT 1 VIEW Result Date: 05/30/2024 CLINICAL DATA:  Sepsis EXAM: PORTABLE CHEST 1 VIEW COMPARISON:  04/07/2013 FINDINGS: Borderline to mild cardiomegaly. Low lung volume. Aortic atherosclerosis. No acute airspace disease, pleural effusion or pneumothorax IMPRESSION: No active disease. Borderline to mild cardiomegaly. Electronically Signed   By: Luke Bun M.D.   On: 05/30/2024 19:09   Scheduled Meds:  atorvastatin   80 mg Oral Daily   carvedilol   6.25 mg Oral BID   Chlorhexidine  Gluconate Cloth  6 each Topical Daily   clopidogrel   75 mg Oral Daily   finasteride   5 mg Oral Daily   gabapentin   200 mg Oral TID   insulin  aspart  0-15 Units Subcutaneous TID WC   insulin  aspart  0-5 Units Subcutaneous QHS   NIFEdipine   90 mg Oral Daily   rivaroxaban   2.5 mg Oral BID   tamsulosin   0.8 mg Oral Daily   Continuous Infusions:  cefTRIAXone  (ROCEPHIN )  IV Stopped (05/31/24 1636)    LOS: 2 days   Rendall Carwin M.D on 06/01/2024 at 11:09 AM  Go to [email or web address] - for contact info  [facility] - Office  [phone]  If 7PM-7AM, please contact night-coverage [email or web address] 06/01/2024, 11:09 AM

## 2024-06-02 DIAGNOSIS — A419 Sepsis, unspecified organism: Secondary | ICD-10-CM | POA: Diagnosis not present

## 2024-06-02 DIAGNOSIS — N390 Urinary tract infection, site not specified: Secondary | ICD-10-CM | POA: Diagnosis not present

## 2024-06-02 LAB — GLUCOSE, CAPILLARY
Glucose-Capillary: 237 mg/dL — ABNORMAL HIGH (ref 70–99)
Glucose-Capillary: 167 mg/dL — ABNORMAL HIGH (ref 70–99)
Glucose-Capillary: 171 mg/dL — ABNORMAL HIGH (ref 70–99)
Glucose-Capillary: 226 mg/dL — ABNORMAL HIGH (ref 70–99)

## 2024-06-02 LAB — CBC
HCT: 30.8 % — ABNORMAL LOW (ref 39.0–52.0)
Hemoglobin: 9.7 g/dL — ABNORMAL LOW (ref 13.0–17.0)
MCH: 29.8 pg (ref 26.0–34.0)
MCHC: 31.5 g/dL (ref 30.0–36.0)
MCV: 94.8 fL (ref 80.0–100.0)
Platelets: 333 K/uL (ref 150–400)
RBC: 3.25 MIL/uL — ABNORMAL LOW (ref 4.22–5.81)
RDW: 12.8 % (ref 11.5–15.5)
WBC: 9.7 K/uL (ref 4.0–10.5)
nRBC: 0 % (ref 0.0–0.2)

## 2024-06-02 LAB — BASIC METABOLIC PANEL WITH GFR
Anion gap: 7 (ref 5–15)
BUN: 18 mg/dL (ref 8–23)
CO2: 28 mmol/L (ref 22–32)
Calcium: 8 mg/dL — ABNORMAL LOW (ref 8.9–10.3)
Chloride: 102 mmol/L (ref 98–111)
Creatinine, Ser: 1.23 mg/dL (ref 0.61–1.24)
GFR, Estimated: >60 mL/min (ref >60)
Glucose, Bld: 224 mg/dL — ABNORMAL HIGH (ref 70–99)
Potassium: 4 mmol/L (ref 3.5–5.1)
Sodium: 137 mmol/L (ref 135–145)

## 2024-06-02 NOTE — Progress Notes (Signed)
 SLP Cancellation Note  Patient Details Name: Jason Graham MRN: [id number] DOB: Feb 13, 1961   Cancelled treatment:       Reason Eval/Treat Not Completed: SLP screened, no needs identified, will sign off. Speech, language and cognition are functioning at baseline. Our service will sign off. Thank you for this referral,  [name], CCC-SLP Speech Language Pathologist    [name] 06/02/2024, 3:35 PM

## 2024-06-02 NOTE — Progress Notes (Signed)
 PROGRESS NOTE  Jason Graham, is a 63 y.o. male, DOB - [birth year], FMW:[id number]  Admit date - 05/30/2024   Admitting Physician Jason FORBES Carwin, MD  Outpatient Primary MD for the patient is Jason Eleanor CROME, MD  LOS - 3  Chief Complaint  Patient presents with   Leg Swelling      Brief Narrative:  63 y.o. male with medical history significant for stroke with residual right-sided deficits, diabetes mellitus, hypertension mated on 05/30/2024 with GNR sepsis from urinary source and acute urinary retention requiring Foley catheter placement    -Assessment and Plan: 1)Proteus Sepsis from urinary source--POA - Blood cultures from 05/30/24 with staph species probably contaminant await further data Urine Cx from 05/30/24 with Proteus mirabilis, sensitive pending WBC 22.1 >>15.7>>9.7 - Continue Rocephin  pending further culture data  2)Acute urinary retention/BPH--continue Flomax  at increased dose of 0.8 mg daily along with Proscar  -- Continue Foley in situ - Patient will need to keep Foley in at discharge and follow-up urology as outpatient for postvoid trial  3)HFpEF--acute on chronic diastolic dysfunction CHF with bilateral lower extremity edema - Echo from 05/31/24 with EF of 65 to 70% grade 1 diastolic dysfunction, no arctic stenosis, no mitral stenosis - Restarted Lasix  20 mg daily--first dose 06/01/2024  4)PAD--- with prior stent placement  --continue Xarelto  at low-dose of 2.5 mg twice daily for PAD - Continue atorvastatin   5)H/o prior strokes with residual right-sided hemiparesis - Continue atorvastatin  and Plavix  for secondary stroke prophylaxis  6)HTN--stable, continue Procardia  XL and Coreg   7)DM2- .  A1c 6.5 reflecting good diabetic control PTA - Metformin  and Januvia  remains on hold Use Novolog /Humalog Sliding scale insulin  with Accu-Cheks/Fingersticks as ordered   8)AKI----acute kidney injury -   creatinine on admission=1.89  ,  baseline creatinine = no recent  values available, previously normal apparently    ,  creatinine has now normalized -Monitor renal function closely with initiation of Lasix  --renally adjust medications, avoid nephrotoxic agents / dehydration  / hypotension  9)Generalized weakness/deconditioning--physical therapy eval appreciated recommends outpatient PT  10)Constipation--improving with laxatives  Status is: Inpatient   Disposition: The patient is from: Home              Anticipated d/c is to: Home              Anticipated d/c date is: 1 day              Patient currently is not medically stable to d/c. Barriers: Not Clinically Stable-   Code Status :  -  Code Status: Full Code   Family Communication:   NA (patient is alert, awake and coherent)   At pt's request called and updated patient's sister Jason Graham [phone]  DVT Prophylaxis  :   - SCDs    rivaroxaban  (XARELTO ) tablet 2.5 mg   Lab Results  Component Value Date   PLT 333 06/02/2024   Inpatient Medications  Scheduled Meds:  atorvastatin   80 mg Oral Daily   carvedilol   6.25 mg Oral BID   Chlorhexidine  Gluconate Cloth  6 each Topical Daily   clopidogrel   75 mg Oral Daily   finasteride   5 mg Oral Daily   furosemide   20 mg Oral Daily   gabapentin   200 mg Oral TID   HYDROcodone -acetaminophen   1 tablet Oral BID   insulin  aspart  0-15 Units Subcutaneous TID WC   insulin  aspart  0-5 Units Subcutaneous QHS   NIFEdipine   90 mg Oral Daily   rivaroxaban   2.5 mg Oral BID   senna-docusate  2 tablet Oral QHS   tamsulosin   0.8 mg Oral Daily   Continuous Infusions:  cefTRIAXone  (ROCEPHIN )  IV 2 g (06/01/24 1451)   PRN Meds:.acetaminophen  **OR** acetaminophen , HYDROcodone -acetaminophen , ondansetron  **OR** ondansetron  (ZOFRAN ) IV, polyethylene glycol   Anti-infectives (From admission, onward)    Start     Dose/Rate Route Frequency Ordered Stop   05/31/24 1500  cefTRIAXone  (ROCEPHIN ) 2 g in sodium chloride  0.9 % 100 mL IVPB        2 g 200 mL/hr  over 30 Minutes Intravenous Every 24 hours 05/30/24 2003     05/30/24 1445  cefTRIAXone  (ROCEPHIN ) 2 g in sodium chloride  0.9 % 100 mL IVPB        2 g 200 mL/hr over 30 Minutes Intravenous  Once 05/30/24 1443 05/30/24 1526      Subjective: Jason Graham today has no fevers, no emesis,  No chest pain,   - Had BM with laxatives - No additional new concerns - Ambulated with physical therapist  Objective: Vitals:   06/01/24 1210 06/01/24 2023 06/02/24 0556 06/02/24 1217  BP: 110/61 107/62 130/68 102/78  Pulse: 68 70 77 77  Resp: 18 18 15 16   Temp: 97.9 F (36.6 C) 98.2 F (36.8 C) 98 F (36.7 C) 98.4 F (36.9 C)  TempSrc: Oral Oral Oral Oral  SpO2: 94% 93% 93% 99%  Weight:      Height:        Intake/Output Summary (Last 24 hours) at 06/02/2024 1555 Last data filed at 06/02/2024 1500 Gross per 24 hour  Intake 600 ml  Output 2900 ml  Net -2300 ml   Filed Weights   05/30/24 1310  Weight: 70.3 kg    Physical Exam Gen:- Awake Alert, no acute distress HEENT:- .AT, No sclera icterus Neck-Supple Neck,No JVD,.  Lungs-improving air movement, no wheezing  CV- S1, S2 normal, regular  Abd-  +ve B.Sounds, Abd Soft, No tenderness, no CVA area tenderness Extremity/Skin:-Improving edema, pedal pulses present  Psych-affect is appropriate, oriented x3 Neuro-generalized weakness, no new focal deficits, no tremors GU--Foley in situ with clear urine  Data Reviewed: I have personally reviewed following labs and imaging studies  CBC: Recent Labs  Lab 05/30/24 1322 05/31/24 0618 06/02/24 0431  WBC 22.1* 15.7* 9.7  NEUTROABS 19.5*  --   --   HGB 12.3* 10.0* 9.7*  HCT 38.1* 30.9* 30.8*  MCV 94.8 96.0 94.8  PLT 499* 334 333   Basic Metabolic Panel: Recent Labs  Lab 05/30/24 1322 05/31/24 0618 06/02/24 0431  NA 139 140 137  K 5.1 4.5 4.0  CL 103 106 102  CO2 21* 28 28  GLUCOSE 223* 148* 224*  BUN 23 18 18   CREATININE 1.89* 1.41* 1.23  CALCIUM  10.2 8.8* 8.0*    GFR: Estimated Creatinine Clearance: 57.9 mL/min (by C-G formula based on SCr of 1.23 mg/dL). Liver Function Tests: Recent Labs  Lab 05/30/24 1322  AST 17  ALT 11  ALKPHOS 91  BILITOT 0.3  PROT 7.4  ALBUMIN  4.2   BNP (last 3 results) Recent Labs    05/30/24 1322  PROBNP 1,002.0*   HbA1C: Recent Labs    05/31/24 0500  HGBA1C 6.5*   Recent Results (from the past 240 hours)  Culture, blood (routine x 2)     Status: None (Preliminary result)   Collection Time: 05/30/24  1:22 PM   Specimen: BLOOD  Result Value Ref Range Status   Specimen Description BLOOD LEFT  ASSIST CONTROL  Final   Special Requests   Final    BOTTLES DRAWN AEROBIC AND ANAEROBIC Blood Culture adequate volume   Culture   Final    NO GROWTH 3 DAYS Performed at [facility], [address] [place], [place] [address]    Report Status PENDING  Incomplete  Urine Culture     Status: Abnormal (Preliminary result)   Collection Time: 05/30/24  1:40 PM   Specimen: Urine, Random  Result Value Ref Range Status   Specimen Description   Final    URINE, RANDOM Performed at [facility], [address], [place], [place] [address]    Special Requests   Final    NONE Reflexed from [id number] Performed at [facility], [address], [place], [place] [address]    Culture (A)  Final    >=100,000 COLONIES/mL PROTEUS MIRABILIS CONFIRMATION OF SUSCEPTIBILITIES IN PROGRESS Performed at [facility], [address] [place], [place] [address]    Report Status PENDING  Incomplete  Culture, blood (routine x 2)     Status: Abnormal   Collection Time: 05/30/24  2:24 PM   Specimen: BLOOD  Result Value Ref Range Status   Specimen Description   Final    BLOOD BLOOD RIGHT ARM AEROBIC BOTTLE ONLY Performed at [facility], [address], [place], [place] [address]    Special Requests   Final    Blood Culture results may not be optimal due to an inadequate volume of blood received in culture bottles Performed at [facility], [address] [place], [place] [address]    Culture  Setup Time   Final    GRAM POSITIVE COCCI AEROBIC BOTTLE ONLY Gram Stain Report Called to,Read Back By and Verified With: MADISON @ 1354 ON [id number] BY HENDERSON L CRITICAL RESULT CALLED TO, READ BACK BY AND VERIFIED WITH: RN G. MADISON [id number] @ 1800 FH    Culture (A)  Final    STAPHYLOCOCCUS HOMINIS THE SIGNIFICANCE OF ISOLATING THIS ORGANISM FROM A SINGLE SET OF BLOOD CULTURES WHEN MULTIPLE SETS ARE DRAWN IS UNCERTAIN. PLEASE NOTIFY THE MICROBIOLOGY DEPARTMENT WITHIN ONE WEEK IF SPECIATION AND SENSITIVITIES ARE REQUIRED. Performed at [facility], [address] [place], [place] [address]    Report Status 06/01/2024 FINAL  Final  Blood Culture ID Panel (Reflexed)     Status: Abnormal   Collection Time: 05/30/24  2:24 PM  Result Value Ref Range Status   Enterococcus faecalis NOT DETECTED NOT DETECTED Final   Enterococcus Faecium NOT DETECTED NOT DETECTED Final   Listeria monocytogenes NOT DETECTED NOT DETECTED Final   Staphylococcus species DETECTED (A) NOT DETECTED Final    Comment: CRITICAL RESULT CALLED TO, READ BACK BY AND VERIFIED WITH: RN G. MADISON [id number] @ 1800 FH    Staphylococcus aureus (BCID) NOT DETECTED NOT DETECTED Final   Staphylococcus epidermidis NOT DETECTED NOT DETECTED Final   Staphylococcus lugdunensis NOT DETECTED NOT DETECTED Final   Streptococcus species NOT DETECTED NOT DETECTED Final   Streptococcus agalactiae NOT DETECTED NOT DETECTED Final   Streptococcus pneumoniae NOT DETECTED NOT DETECTED Final   Streptococcus pyogenes NOT DETECTED NOT DETECTED Final   A.calcoaceticus-baumannii NOT DETECTED NOT DETECTED Final   Bacteroides fragilis NOT DETECTED NOT DETECTED Final   Enterobacterales NOT DETECTED NOT DETECTED Final   Enterobacter cloacae complex NOT DETECTED NOT DETECTED Final   Escherichia coli NOT DETECTED NOT DETECTED Final   Klebsiella aerogenes NOT DETECTED NOT DETECTED Final    Klebsiella  oxytoca NOT DETECTED NOT DETECTED Final   Klebsiella pneumoniae NOT DETECTED NOT DETECTED Final   Proteus species NOT DETECTED NOT DETECTED Final   Salmonella species NOT DETECTED NOT DETECTED Final   Serratia marcescens NOT DETECTED NOT DETECTED Final   Haemophilus influenzae NOT DETECTED NOT DETECTED Final   Neisseria meningitidis NOT DETECTED NOT DETECTED Final   Pseudomonas aeruginosa NOT DETECTED NOT DETECTED Final   Stenotrophomonas maltophilia NOT DETECTED NOT DETECTED Final   Candida albicans NOT DETECTED NOT DETECTED Final   Candida auris NOT DETECTED NOT DETECTED Final   Candida glabrata NOT DETECTED NOT DETECTED Final   Candida krusei NOT DETECTED NOT DETECTED Final   Candida parapsilosis NOT DETECTED NOT DETECTED Final   Candida tropicalis NOT DETECTED NOT DETECTED Final   Cryptococcus neoformans/gattii NOT DETECTED NOT DETECTED Final    Comment: Performed at [facility], [address] [place], [place] [address]    Scheduled Meds:  atorvastatin   80 mg Oral Daily   carvedilol   6.25 mg Oral BID   Chlorhexidine  Gluconate Cloth  6 each Topical Daily   clopidogrel   75 mg Oral Daily   finasteride   5 mg Oral Daily   furosemide   20 mg Oral Daily   gabapentin   200 mg Oral TID   HYDROcodone -acetaminophen   1 tablet Oral BID   insulin  aspart  0-15 Units Subcutaneous TID WC   insulin  aspart  0-5 Units Subcutaneous QHS   NIFEdipine   90 mg Oral Daily   rivaroxaban   2.5 mg Oral BID   senna-docusate  2 tablet Oral QHS   tamsulosin   0.8 mg Oral Daily   Continuous Infusions:  cefTRIAXone  (ROCEPHIN )  IV 2 g (06/01/24 1451)    LOS: 3 days   Rendall Graham M.D on 06/02/2024 at 3:55 PM  Go to [email or web address] - for contact info  [facility] - Office  [phone]  If 7PM-7AM, please contact night-coverage [email or web address] 06/02/2024, 3:55 PM

## 2024-06-02 NOTE — Evaluation (Signed)
 Physical Therapy Evaluation Patient Details Name: Jason Graham MRN: [id number] DOB: [birth year] Today's Date: 06/02/2024  History of Present Illness  63 y.o. male with medical history significant for stroke with residual right-sided deficits, diabetes mellitus, hypertension mated on 05/30/2024 with GNR sepsis from urinary source and acute urinary retention requiring Foley catheter placement   Clinical Impression  Patient demonstrates decreased LE strength, decreased endurance, and impaired balance. Patient also demonstrates difficulty with ambulation during today's session with decreased stride length and velocity noted (sores on R heel). Patient also demonstrates increased glucose level in the mid 200s. Patient requires education on importance of diabetic foot care and increased physical activity. Patient would benefit from skilled acute physical therapy for increased endurance with ambulation, increased LE strength, and balance for improved gait quality, return to higher level of function with ADLs, and progress towards therapy goals.         If plan is discharge home, recommend the following: A little help with walking and/or transfers;A little help with bathing/dressing/bathroom;Assistance with cooking/housework;Help with stairs or ramp for entrance;Assist for transportation   Can travel by private vehicle        Equipment Recommendations None recommended by PT  Recommendations for Other Services       Functional Status Assessment Patient has had a recent decline in their functional status and demonstrates the ability to make significant improvements in function in a reasonable and predictable amount of time.     Precautions / Restrictions Precautions Precautions: Fall Recall of Precautions/Restrictions: Intact Restrictions Weight Bearing Restrictions Per Provider Order: No      Mobility  Bed Mobility Overal bed mobility: Modified Independent                   Transfers Overall transfer level: Modified independent Equipment used: Rolling walker (2 wheels)                    Ambulation/Gait Ambulation/Gait assistance: Contact guard assist, Min assist Gait Distance (Feet): 30 Feet Assistive device: Rolling walker (2 wheels) Gait Pattern/deviations: Decreased step length - right, Decreased stride length, Steppage, Shuffle Gait velocity: decreased     General Gait Details: heavily dependent on RW for balance  Stairs            Wheelchair Mobility     Tilt Bed    Modified Rankin (Stroke Patients Only)       Balance Overall balance assessment: Needs assistance Sitting-balance support: Single extremity supported Sitting balance-Leahy Scale: Fair       Standing balance-Leahy Scale: Poor                               Pertinent Vitals/Pain Pain Assessment Pain Assessment: 0-10 Pain Score: 0-No pain    Home Living Family/patient expects to be discharged to:: Private residence Living Arrangements: Alone Available Help at Discharge: Friend(s) Type of Home: Mobile home Home Access: Ramped entrance       Home Layout: One level Home Equipment: Agricultural Consultant (2 wheels);Wheelchair - manual      Prior Function Prior Level of Function : Needs assist             Mobility Comments: uses scooter in community ADLs Comments: independent, friend helps as needed     Extremity/Trunk Assessment   Upper Extremity Assessment Upper Extremity Assessment: Overall WFL for tasks assessed    Lower Extremity Assessment Lower Extremity Assessment: Generalized weakness (both LE with sores  from fall, RLE has heel wound)    Cervical / Trunk Assessment Cervical / Trunk Assessment: Normal  Communication   Communication Communication: No apparent difficulties    Cognition Arousal: Alert Behavior During Therapy: WFL for tasks assessed/performed   PT - Cognitive impairments: No apparent impairments                          Following commands: Intact       Cueing Cueing Techniques: Verbal cues     General Comments      Exercises     Assessment/Plan    PT Assessment Patient needs continued PT services  PT Problem List Decreased strength;Decreased balance;Decreased mobility;Decreased range of motion;Decreased activity tolerance;Decreased knowledge of use of DME       PT Treatment Interventions DME instruction;Gait training;Stair training;Functional mobility training;Neuromuscular re-education;Balance training;Patient/family education;Therapeutic exercise;Therapeutic activities    PT Goals (Current goals can be found in the Care Plan section)  Acute Rehab PT Goals Patient Stated Goal: pt would like to return home and have therapy in OP setting PT Goal Formulation: With patient Time For Goal Achievement: 06/04/24 Potential to Achieve Goals: Good    Frequency Min 2X/week     Co-evaluation               AM-PAC PT 6 Clicks Mobility  Outcome Measure Help needed turning from your back to your side while in a flat bed without using bedrails?: None Help needed moving from lying on your back to sitting on the side of a flat bed without using bedrails?: None Help needed moving to and from a bed to a chair (including a wheelchair)?: A Little Help needed standing up from a chair using your arms (e.g., wheelchair or bedside chair)?: A Little Help needed to walk in hospital room?: A Little Help needed climbing 3-5 steps with a railing? : A Lot 6 Click Score: 19    End of Session Equipment Utilized During Treatment: Gait belt Activity Tolerance: Patient tolerated treatment well;Patient limited by fatigue Patient left: in bed;with bed alarm set Nurse Communication: Mobility status;Precautions PT Visit Diagnosis: Unsteadiness on feet (R26.81);Other abnormalities of gait and mobility (R26.89);Muscle weakness (generalized) (M62.81);History of falling (Z91.81);Difficulty  in walking, not elsewhere classified (R26.2)    Time: 9276-9256 PT Time Calculation (min) (ACUTE ONLY): 20 min   Charges:   PT Evaluation $PT Eval Low Complexity: 1 Low PT Treatments $Therapeutic Activity: 8-22 mins PT General Charges $$ ACUTE PT VISIT: 1 Visit         Lang Ada, PT, DPT [facility] Office: [phone] 10:10 AM, 06/02/2024

## 2024-06-02 NOTE — Progress Notes (Addendum)
 1216 Discharge order in, RN messaged Dr Rendall regarding the med rec completion in order for RN can print the paperwork . MD advised waiting on urine sensitivities prior to dc and med rec completion.   At 1512: RN called lab, Urine pending for sensitivities, would like to know if will result soon. Need these results for discharge, per MD.   At 1522: Made Dr Rendall aware that lab stated, will not get these results likely in today, they are at [facility] being processed.

## 2024-06-02 NOTE — Plan of Care (Signed)

## 2024-06-02 NOTE — TOC Transition Note (Signed)
 Transition of Care [facility]) - Discharge Note   Patient Details  Name: Jason Graham MRN: [id number] Date of Birth: Apr 03, 1961  [facility] CM/SW Contact:  Noreen KATHEE [place], LCSWA Phone Number: 06/02/2024, 11:55 AM   Clinical Narrative:     CSW spoke with patient at bedside regarding PT recommendation for OP PT. Patient agreeable and wanted referral to be sent to the OP in [place] , referral sent to [facility] in [place]. ICM signing off.   Final next level of care: OP Rehab Barriers to Discharge: Barriers Resolved   Patient Goals and CMS Choice Patient states their goals for this hospitalization and ongoing recovery are:: return back home CMS Medicare.gov Compare Post Acute Care list provided to:: Patient Choice offered to / list presented to : Patient      Discharge Placement                  Name of family member notified: Fairy Patient and family notified of of transfer: 06/02/24  Discharge Plan and Services Additional resources added to the After Visit Summary for                                       Social Drivers of Health (SDOH) Interventions SDOH Screenings   Food Insecurity: No Food Insecurity (05/31/2024)  Housing: Low Risk (05/31/2024)  Transportation Needs: No Transportation Needs (05/31/2024)  Utilities: Not At Risk (05/31/2024)  Social Connections: Socially Isolated (05/31/2024)  Tobacco Use: High Risk (05/31/2024)     Readmission Risk Interventions    06/02/2024   11:54 AM  Readmission Risk Prevention Plan  Medication Screening Complete  Transportation Screening Complete

## 2024-06-02 NOTE — Plan of Care (Signed)
°  Problem: Acute Rehab PT Goals(only PT should resolve) Goal: Pt Will Go Supine/Side To Sit Outcome: Progressing Flowsheets (Taken 06/02/2024 1011) Pt will go Supine/Side to Sit: Independently Goal: Patient Will Transfer Sit To/From Stand Outcome: Progressing Flowsheets (Taken 06/02/2024 1011) Patient will transfer sit to/from stand: Independently Goal: Pt Will Transfer Bed To Chair/Chair To Bed Outcome: Progressing Flowsheets (Taken 06/02/2024 1011) Pt will Transfer Bed to Chair/Chair to Bed: with modified independence Goal: Pt Will Ambulate Outcome: Progressing Flowsheets (Taken 06/02/2024 1011) Pt will Ambulate:  50 feet  with modified independence  with rolling walker  Lang Ada, PT, DPT [facility] Office: [phone] 10:11 AM, 06/02/2024

## 2024-06-03 LAB — URINE CULTURE: Culture: >=100000 — AB

## 2024-06-03 LAB — GLUCOSE, CAPILLARY
Glucose-Capillary: 179 mg/dL — ABNORMAL HIGH (ref 70–99)
Glucose-Capillary: 195 mg/dL — ABNORMAL HIGH (ref 70–99)

## 2024-06-03 MED ORDER — METFORMIN HCL ER (MOD) 1000 MG PO TB24: 1000.0000 mg | ORAL_TABLET | Freq: Two times a day (BID) | ORAL | 5 refills | Status: AC

## 2024-06-03 MED ORDER — CARVEDILOL 6.25 MG PO TABS: 6.2500 mg | ORAL_TABLET | Freq: Two times a day (BID) | ORAL | 5 refills | Status: AC

## 2024-06-03 MED ORDER — POTASSIUM CHLORIDE ER 20 MEQ PO TBCR: 20.0000 meq | EXTENDED_RELEASE_TABLET | Freq: Every day | ORAL | 1 refills | Status: AC

## 2024-06-03 MED ORDER — LISINOPRIL 5 MG PO TABS: 5.0000 mg | ORAL_TABLET | Freq: Every day | ORAL | 3 refills | Status: AC

## 2024-06-03 MED ORDER — ACETAMINOPHEN 325 MG PO TABS: 650.0000 mg | ORAL_TABLET | Freq: Four times a day (QID) | ORAL | Status: AC | PRN

## 2024-06-03 MED ORDER — INSULIN GLARGINE 100 UNIT/ML SOLOSTAR PEN: 10.0000 [IU] | PEN_INJECTOR | Freq: Every day | SUBCUTANEOUS | 3 refills | Status: AC

## 2024-06-03 MED ORDER — CEPHALEXIN 500 MG PO CAPS: 500.0000 mg | ORAL_CAPSULE | Freq: Three times a day (TID) | ORAL | 0 refills | Status: AC

## 2024-06-03 MED ORDER — CLOPIDOGREL BISULFATE 75 MG PO TABS: 75.0000 mg | ORAL_TABLET | Freq: Every day | ORAL | 2 refills | Status: AC

## 2024-06-03 MED ORDER — XARELTO 2.5 MG PO TABS: 2.5000 mg | ORAL_TABLET | Freq: Two times a day (BID) | ORAL | 5 refills | Status: AC

## 2024-06-03 MED ORDER — JANUVIA 100 MG PO TABS: 100.0000 mg | ORAL_TABLET | Freq: Every day | ORAL | 4 refills | Status: AC

## 2024-06-03 MED ORDER — NIFEDIPINE ER OSMOTIC RELEASE 90 MG PO TB24: 90.0000 mg | ORAL_TABLET | Freq: Every day | ORAL | 3 refills | Status: AC

## 2024-06-03 MED ORDER — POLYETHYLENE GLYCOL 3350 17 G PO PACK: 17.0000 g | PACK | Freq: Every day | ORAL | 5 refills | Status: AC

## 2024-06-03 MED ORDER — TAMSULOSIN HCL 0.4 MG PO CAPS: 0.8000 mg | ORAL_CAPSULE | Freq: Every day | ORAL | 5 refills | Status: DC

## 2024-06-03 MED ORDER — ATORVASTATIN CALCIUM 80 MG PO TABS: 80.0000 mg | ORAL_TABLET | Freq: Every day | ORAL | 3 refills | Status: AC

## 2024-06-03 MED ORDER — SENNOSIDES-DOCUSATE SODIUM 8.6-50 MG PO TABS: 2.0000 | ORAL_TABLET | Freq: Every day | ORAL | 5 refills | Status: AC

## 2024-06-03 NOTE — Care Management Important Message (Signed)
 Important Message  Patient Details  Name: Jason Graham MRN: [id number] Date of Birth: 10-11-1960   Important Message Given:  Yes - Medicare IM     [name] [name] 06/03/2024, 11:18 AM
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
## 2024-06-03 NOTE — Progress Notes (Signed)
 Patient voiced c/o foot pain rating 5/10 on pain scale, patient currently has Norco PRN at bedtime. Writer made Dr. Manfred aware with MD stating to give Norco dose early. Writer messaged pharamcy aware of MD request

## 2024-06-03 NOTE — Discharge Instructions (Addendum)
 1)Please follow-up with Urologist Dr. Sherrilee in about --- in 1 week for possible removal of Foley catheter and voiding trial and  follow-up evaluation  in his office----[facility], [address], [address], [place] [address] Phone Number----[phone]  2)Outpatient physical therapy as advised  3)Avoid ibuprofen/Advil/Aleve /Motrin/Goody Powders/Naproxen /BC powders/Meloxicam/Diclofenac/Indomethacin and other Nonsteroidal anti-inflammatory medications as these will make you more likely to bleed and can cause stomach ulcers, can also cause Kidney problems.

## 2024-06-03 NOTE — Discharge Summary (Signed)
 Jason Graham, is a 63 y.o. male  DOB [birth year]  MRN [id number].  Admission date:  05/30/2024  Admitting Physician  Tully FORBES Carwin, MD  Discharge Date:  06/03/2024   Primary MD  Waddell Eleanor CROME, MD  Recommendations for primary care physician for things to follow:  1)Please follow-up with Urologist Dr. Sherrilee in about --- in 1 week for possible removal of Foley catheter and voiding trial and  follow-up evaluation  in his office----[facility], [address], [address], [place] [address] Phone Number----[phone]  2)Outpatient physical therapy as advised  3)Avoid ibuprofen/Advil/Aleve /Motrin/Goody Powders/Naproxen /BC powders/Meloxicam/Diclofenac/Indomethacin and other Nonsteroidal anti-inflammatory medications as these will make you more likely to bleed and can cause stomach ulcers, can also cause Kidney problems.   Admission Diagnosis  Sepsis secondary to UTI (HCC) [A41.9, N39.0]   Discharge Diagnosis  Sepsis secondary to UTI (HCC) [A41.9, N39.0]  ***  Principal Problem:   Sepsis secondary to UTI [facility]) Active Problems:   Bilateral lower extremity edema   Acute urinary retention   AKI (acute kidney injury)   DM (diabetes mellitus) (HCC)   Essential hypertension uncontrolled   History of stroke with residual deficit   Chronic anticoagulation   Peripheral artery disease      Past Medical History:  Diagnosis Date   Arthritis    Diabetes mellitus without complication (HCC)    Hypertension     Past Surgical History:  Procedure Laterality Date   LAPAROTOMY N/A 04/07/2013   Procedure: EXPLORATORY LAPAROTOMY, REPAIR OF SMALL BOWEL ENTEROTOMY, REPAIR OF FASCIAL DEFECT;  Surgeon: Vicenta DELENA Poli, MD;  Location: [facility] OR;  Service: General;  Laterality: N/A;       HPI  from the history and physical done on the day of admission:     ***  ****     Hospital Course:      No notes on file  ***** Assessment and Plan: No notes have been filed under this hospital service. Service: Hospitalist       Discharge Condition: ***  Follow UP   Contact information for follow-up providers     McKenzie, Belvie CROME, MD Follow up.   Specialty: Urology Contact information: [address]  [address] [place] [address] [phone]              Contact information for after-discharge care     Home Medical Care     [facility] [place] .   Service: Home Rehabilitation Contact information: [address] [place]  [address] [phone]                      Consults obtained - ***  Diet and Activity recommendation:  As advised  Discharge Instructions    **** Discharge Instructions     Ambulatory referral to Physical Therapy   Complete by: As directed    Ambulatory referral to Physical Therapy   Complete by: As directed    Call MD for:  difficulty  breathing, headache or visual disturbances   Complete by: As directed    Call MD for:  persistant dizziness or light-headedness   Complete by: As directed    Call MD for:  persistant nausea and vomiting   Complete by: As directed    Call MD for:  temperature >100.4   Complete by: As directed    Diet Carb Modified   Complete by: As directed    Discharge instructions   Complete by: As directed    1)Please follow-up with Urologist Dr. Sherrilee in about --- in 1 week for possible removal of Foley catheter and voiding trial and  follow-up evaluation  in his office----[facility], [address], [place] [address] Phone Number----[phone]  2)Outpatient physical therapy as advised  3)Avoid ibuprofen/Advil/Aleve /Motrin/Goody Powders/Naproxen /BC powders/Meloxicam/Diclofenac/Indomethacin and other Nonsteroidal anti-inflammatory medications as these will make you more likely to bleed and can  cause stomach ulcers, can also cause Kidney problems.   Increase activity slowly   Complete by: As directed          Discharge Medications     Allergies as of 06/03/2024   No Known Allergies      Medication List     STOP taking these medications    Conforming Rolled Gauze Misc   GAUZE PADS 4X4 4X4 Pads   potassium chloride  SA 20 MEQ tablet Commonly known as: KLOR-CON  M       TAKE these medications    acetaminophen  325 MG tablet Commonly known as: TYLENOL  Take 2 tablets (650 mg total) by mouth every 6 (six) hours as needed for mild pain (pain score 1-3) or fever (or Fever >/= 101).   Alcohol  Wipes 70 % Pads 1 application by Does not apply route 4 (four) times daily -  before meals and at bedtime. For blood sugar check   atorvastatin  80 MG tablet Commonly known as: LIPITOR Take 1 tablet (80 mg total) by mouth daily.   blood glucose meter kit and supplies Relion Prime or Dispense other brand based on patient and insurance preference. Use up to four times daily as directed. (FOR ICD-9 250.00, 250.01).   carvedilol  6.25 MG tablet Commonly known as: COREG  Take 1 tablet (6.25 mg total) by mouth 2 (two) times daily.   cephALEXin  500 MG capsule Commonly known as: KEFLEX  Take 1 capsule (500 mg total) by mouth 3 (three) times daily for 5 days.   clopidogrel  75 MG tablet Commonly known as: PLAVIX  Take 1 tablet (75 mg total) by mouth daily.   finasteride  5 MG tablet Commonly known as: PROSCAR  Take 5 mg by mouth daily.   furosemide  20 MG tablet Commonly known as: LASIX  Take 20 mg by mouth 2 (two) times daily.   gabapentin  100 MG capsule Commonly known as: NEURONTIN  Take 200 mg by mouth 3 (three) times daily.   HYDROcodone -acetaminophen  5-325 MG tablet Commonly known as: NORCO/VICODIN Take 1 tablet by mouth in the morning and at bedtime.   insulin  glargine 100 UNIT/ML Solostar Pen Commonly known as: LANTUS  Inject 10 Units into the skin at  bedtime. What changed: how much to take   Januvia  100 MG tablet Generic drug: sitaGLIPtin  Take 1 tablet (100 mg total) by mouth daily.   lactobacillus acidophilus & bulgar chewable tablet Chew 1 tablet by mouth 3 (three) times daily with meals.   lisinopril  5 MG tablet Commonly known as: ZESTRIL  Take 1 tablet (5 mg total) by mouth daily.   metFORMIN  1000 MG (MOD) 24 hr tablet  Commonly known as: GLUMETZA  Take 1 tablet (1,000 mg total) by mouth 2 (two) times daily. What changed: medication strength   NIFEdipine  90 MG 24 hr tablet Commonly known as: PROCARDIA  XL/NIFEDICAL-XL Take 1 tablet (90 mg total) by mouth daily.   polyethylene glycol 17 g packet Commonly known as: MiraLax  Take 17 g by mouth daily.   Potassium Chloride  ER 20 MEQ Tbcr Take 1 tablet (20 mEq total) by mouth daily. 1 tab daily by mouth--- take while taking Lasix /furosemide    senna-docusate 8.6-50 MG tablet Commonly known as: Senokot-S Take 2 tablets by mouth at bedtime.   tamsulosin  0.4 MG Caps capsule Commonly known as: FLOMAX  Take 2 capsules (0.8 mg total) by mouth daily.   Xarelto  2.5 MG Tabs tablet Generic drug: rivaroxaban  Take 1 tablet (2.5 mg total) by mouth 2 (two) times daily.        Major procedures and Radiology Reports - PLEASE review detailed and final reports for all details, in brief -   ***  US  RENAL Result Date: 05/31/2024 EXAM: US  Retroperitoneum Complete, Renal. 05/31/2024 09:26:36 AM TECHNIQUE: Real-time ultrasonography of the retroperitoneum renal was performed. COMPARISON: None available CLINICAL HISTORY: AKI (acute kidney injury). FINDINGS: FINDINGS: RIGHT KIDNEY/URETER: Right kidney measures 11.2 x 5.2 x 5.5 cm (168 mL). Normal cortical echogenicity. No hydronephrosis. No calculus. No mass. LEFT KIDNEY/URETER: Left kidney measures 11.6 x 6.6 x 5.4 cm (216 mL). Normal cortical echogenicity. Moderate left hydronephrosis. No calculus. No mass. BLADDER: Bladder has a thick wall  but is decompressed by an indwelling foley catheter. IMPRESSION: 1. Moderate left hydronephrosis. 2. Bladder decompressed by an indwelling Foley catheter. Electronically signed by: Pinkie Pebbles MD 05/31/2024 08:08 PM EST RP Workstation: [id number]   ECHOCARDIOGRAM COMPLETE Result Date: 05/31/2024    ECHOCARDIOGRAM REPORT   Patient Name:   Jason Graham Date of Exam: 05/31/2024 Medical Rec #:  [id number]   Height:       68.0 in Accession #:    [id number]  Weight:       155.0 lb Date of Birth:  [birth year]    BSA:          1.834 m Patient Age:    63 years    BP:           108/61 mmHg Patient Gender: M           HR:           79 bpm. Exam Location:  [facility] Procedure: 2D Echo, Cardiac Doppler and Color Doppler (Both Spectral and Color            Flow Doppler were utilized during procedure). Indications:    Bilateral leg edema  History:        Patient has no prior history of Echocardiogram examinations.                 PAD; Risk Factors:Hypertension, Diabetes and Current Smoker.  Sonographer:    Merlynn Argyle Referring Phys: [phone] EJIROGHENE E Keondra Haydu IMPRESSIONS  1. Left ventricular ejection fraction, by estimation, is 65 to 70%. The left ventricle has normal function. The left ventricle has no regional wall motion abnormalities. There is mild left ventricular hypertrophy. Left ventricular diastolic parameters are consistent with Grade I diastolic dysfunction (impaired relaxation).  2. Right ventricular systolic function is normal. The right ventricular size is normal.  3. The mitral valve is normal in structure. No evidence of mitral valve regurgitation. No evidence of mitral stenosis.  4. The aortic valve is tricuspid. Aortic  valve regurgitation is not visualized. No aortic stenosis is present.  5. The inferior vena cava is normal in size with greater than 50% respiratory variability, suggesting right atrial pressure of 3 mmHg. FINDINGS  Left Ventricle: Left ventricular ejection fraction, by estimation, is 65 to  70%. The left ventricle has normal function. The left ventricle has no regional wall motion abnormalities. The left ventricular internal cavity size was normal in size. There is  mild left ventricular hypertrophy. Left ventricular diastolic parameters are consistent with Grade I diastolic dysfunction (impaired relaxation). Right Ventricle: The right ventricular size is normal. Right vetricular wall thickness was not well visualized. Right ventricular systolic function is normal. Left Atrium: Left atrial size was normal in size. Right Atrium: Right atrial size was normal in size. Pericardium: There is no evidence of pericardial effusion. Mitral Valve: The mitral valve is normal in structure. No evidence of mitral valve regurgitation. No evidence of mitral valve stenosis. Tricuspid Valve: The tricuspid valve is normal in structure. Tricuspid valve regurgitation is not demonstrated. No evidence of tricuspid stenosis. Aortic Valve: The aortic valve is tricuspid. Aortic valve regurgitation is not visualized. No aortic stenosis is present. Aortic valve mean gradient measures 3.3 mmHg. Aortic valve peak gradient measures 7.4 mmHg. Aortic valve area, by VTI measures 3.09 cm. Pulmonic Valve: The pulmonic valve was not well visualized. Pulmonic valve regurgitation is not visualized. No evidence of pulmonic stenosis. Aorta: The aortic root and ascending aorta are structurally normal, with no evidence of dilitation. Venous: The inferior vena cava is normal in size with greater than 50% respiratory variability, suggesting right atrial pressure of 3 mmHg. IAS/Shunts: The interatrial septum was not well visualized.  LEFT VENTRICLE PLAX 2D LVIDd:         4.70 cm   Diastology LVIDs:         3.30 cm   LV e' medial:    5.87 cm/s LV PW:         1.20 cm   LV E/e' medial:  13.4 LV IVS:        1.20 cm   LV e' lateral:   7.29 cm/s LVOT diam:     2.20 cm   LV E/e' lateral: 10.8 LV SV:         73 LV SV Index:   40 LVOT Area:     3.80 cm LV  IVRT:       67 msec  RIGHT VENTRICLE             IVC RV Basal diam:  3.70 cm     IVC diam: 1.90 cm RV S prime:     13.90 cm/s TAPSE (M-mode): 2.1 cm LEFT ATRIUM             Index        RIGHT ATRIUM           Index LA diam:        4.20 cm 2.29 cm/m   RA Area:     13.20 cm LA Vol (A2C):   52.2 ml 28.46 ml/m  RA Volume:   25.30 ml  13.80 ml/m LA Vol (A4C):   51.6 ml 28.14 ml/m LA Biplane Vol: 54.0 ml 29.45 ml/m  AORTIC VALVE                    PULMONIC VALVE AV Area (Vmax):    2.59 cm     RVOT Peak grad: 5 mmHg AV Area (Vmean):   2.99 cm  AV Area (VTI):     3.09 cm AV Vmax:           136.36 cm/s AV Vmean:          82.773 cm/s AV VTI:            0.236 m AV Peak Grad:      7.4 mmHg AV Mean Grad:      3.3 mmHg LVOT Vmax:         92.90 cm/s LVOT Vmean:        65.200 cm/s LVOT VTI:          0.192 m LVOT/AV VTI ratio: 0.81  AORTA Ao Root diam: 3.60 cm Ao Asc diam:  3.20 cm MITRAL VALVE MV Area (PHT): 3.27 cm     SHUNTS MV Decel Time: 232 msec     Systemic VTI:  0.19 m MV E velocity: 78.80 cm/s   Systemic Diam: 2.20 cm MV A velocity: 106.00 cm/s  Pulmonic VTI:  0.193 m MV E/A ratio:  0.74 Dorn Ross MD Electronically signed by Dorn Ross MD Signature Date/Time: 05/31/2024/3:39:32 PM    Final    DG CHEST PORT 1 VIEW Result Date: 05/30/2024 CLINICAL DATA:  Sepsis EXAM: PORTABLE CHEST 1 VIEW COMPARISON:  04/07/2013 FINDINGS: Borderline to mild cardiomegaly. Low lung volume. Aortic atherosclerosis. No acute airspace disease, pleural effusion or pneumothorax IMPRESSION: No active disease. Borderline to mild cardiomegaly. Electronically Signed   By: Luke Bun M.D.   On: 05/30/2024 19:09    Micro Results   *** Recent Results (from the past 240 hours)  Culture, blood (routine x 2)     Status: None (Preliminary result)   Collection Time: 05/30/24  1:22 PM   Specimen: BLOOD  Result Value Ref Range Status   Specimen Description BLOOD LEFT ASSIST CONTROL  Final   Special Requests   Final     BOTTLES DRAWN AEROBIC AND ANAEROBIC Blood Culture adequate volume   Culture   Final    NO GROWTH 4 DAYS Performed at [facility], [address] [place], [place] [address]    Report Status PENDING  Incomplete  Urine Culture     Status: Abnormal   Collection Time: 05/30/24  1:40 PM   Specimen: Urine, Random  Result Value Ref Range Status   Specimen Description   Final    URINE, RANDOM Performed at [facility], [address] [place], [place] [address]    Special Requests   Final    NONE Reflexed from [id number] Performed at [facility], [address] [place], [place] [address]    Culture >=100,000 COLONIES/mL PROTEUS MIRABILIS (A)  Final   Report Status 06/03/2024 FINAL  Final   Organism ID, Bacteria PROTEUS MIRABILIS (A)  Final      Susceptibility   Proteus mirabilis - MIC*    AMPICILLIN >=32 RESISTANT Resistant     CEFAZOLIN  (URINE) Value in next row Sensitive      8 SENSITIVEThis is a modified FDA-approved test that has been validated and its performance characteristics determined by the reporting laboratory.  This laboratory is certified under the Clinical Laboratory Improvement Amendments CLIA as qualified to perform high complexity clinical laboratory testing.    CEFEPIME Value in next row Sensitive      8 SENSITIVEThis is a modified FDA-approved test that has been validated and its performance characteristics determined by the reporting laboratory.  This laboratory is certified under the Clinical Laboratory Improvement Amendments CLIA as qualified to perform high complexity  clinical laboratory testing.    ERTAPENEM Value in next row Sensitive      8 SENSITIVEThis is a modified FDA-approved test that has been validated and its performance characteristics determined by the reporting laboratory.  This laboratory is certified under the Clinical Laboratory Improvement Amendments CLIA as qualified to perform high complexity clinical laboratory testing.    CEFTRIAXONE  Value in next row  Sensitive      8 SENSITIVEThis is a modified FDA-approved test that has been validated and its performance characteristics determined by the reporting laboratory.  This laboratory is certified under the Clinical Laboratory Improvement Amendments CLIA as qualified to perform high complexity clinical laboratory testing.    CIPROFLOXACIN Value in next row Resistant      8 SENSITIVEThis is a modified FDA-approved test that has been validated and its performance characteristics determined by the reporting laboratory.  This laboratory is certified under the Clinical Laboratory Improvement Amendments CLIA as qualified to perform high complexity clinical laboratory testing.    GENTAMICIN Value in next row Sensitive      8 SENSITIVEThis is a modified FDA-approved test that has been validated and its performance characteristics determined by the reporting laboratory.  This laboratory is certified under the Clinical Laboratory Improvement Amendments CLIA as qualified to perform high complexity clinical laboratory testing.    NITROFURANTOIN Value in next row Resistant      8 SENSITIVEThis is a modified FDA-approved test that has been validated and its performance characteristics determined by the reporting laboratory.  This laboratory is certified under the Clinical Laboratory Improvement Amendments CLIA as qualified to perform high complexity clinical laboratory testing.    TRIMETH/SULFA Value in next row Resistant      8 SENSITIVEThis is a modified FDA-approved test that has been validated and its performance characteristics determined by the reporting laboratory.  This laboratory is certified under the Clinical Laboratory Improvement Amendments CLIA as qualified to perform high complexity clinical laboratory testing.    AMPICILLIN/SULBACTAM Value in next row Intermediate      8 SENSITIVEThis is a modified FDA-approved test that has been validated and its performance characteristics determined by the reporting  laboratory.  This laboratory is certified under the Clinical Laboratory Improvement Amendments CLIA as qualified to perform high complexity clinical laboratory testing.    PIP/TAZO Value in next row Sensitive      <=4 SENSITIVEThis is a modified FDA-approved test that has been validated and its performance characteristics determined by the reporting laboratory.  This laboratory is certified under the Clinical Laboratory Improvement Amendments CLIA as qualified to perform high complexity clinical laboratory testing.    MEROPENEM Value in next row Sensitive      <=4 SENSITIVEThis is a modified FDA-approved test that has been validated and its performance characteristics determined by the reporting laboratory.  This laboratory is certified under the Clinical Laboratory Improvement Amendments CLIA as qualified to perform high complexity clinical laboratory testing.    * >=100,000 COLONIES/mL PROTEUS MIRABILIS  Culture, blood (routine x 2)     Status: Abnormal   Collection Time: 05/30/24  2:24 PM   Specimen: BLOOD  Result Value Ref Range Status   Specimen Description   Final    BLOOD BLOOD RIGHT ARM AEROBIC BOTTLE ONLY Performed at [facility], [address], [place], [place] [address]    Special Requests   Final    Blood Culture results may not be optimal due to an inadequate volume of blood received in culture bottles Performed at [facility],  [address] [place], [place] [address]    Culture  Setup Time   Final    GRAM POSITIVE COCCI AEROBIC BOTTLE ONLY Gram Stain Report Called to,Read Back By and Verified With: MADISON @ 1354 ON [id number] BY HENDERSON L CRITICAL RESULT CALLED TO, READ BACK BY AND VERIFIED WITH: RN G. MADISON [id number] @ 1800 FH    Culture (A)  Final    STAPHYLOCOCCUS HOMINIS THE SIGNIFICANCE OF ISOLATING THIS ORGANISM FROM A SINGLE SET OF BLOOD CULTURES WHEN MULTIPLE SETS ARE DRAWN IS UNCERTAIN. PLEASE NOTIFY THE MICROBIOLOGY DEPARTMENT WITHIN ONE WEEK IF SPECIATION AND  SENSITIVITIES ARE REQUIRED. Performed at [facility], [address], [place], [place] [address]    Report Status 06/01/2024 FINAL  Final  Blood Culture ID Panel (Reflexed)     Status: Abnormal   Collection Time: 05/30/24  2:24 PM  Result Value Ref Range Status   Enterococcus faecalis NOT DETECTED NOT DETECTED Final   Enterococcus Faecium NOT DETECTED NOT DETECTED Final   Listeria monocytogenes NOT DETECTED NOT DETECTED Final   Staphylococcus species DETECTED (A) NOT DETECTED Final    Comment: CRITICAL RESULT CALLED TO, READ BACK BY AND VERIFIED WITH: RN G. MADISON [id number] @ 1800 FH    Staphylococcus aureus (BCID) NOT DETECTED NOT DETECTED Final   Staphylococcus epidermidis NOT DETECTED NOT DETECTED Final   Staphylococcus lugdunensis NOT DETECTED NOT DETECTED Final   Streptococcus species NOT DETECTED NOT DETECTED Final   Streptococcus agalactiae NOT DETECTED NOT DETECTED Final   Streptococcus pneumoniae NOT DETECTED NOT DETECTED Final   Streptococcus pyogenes NOT DETECTED NOT DETECTED Final   A.calcoaceticus-baumannii NOT DETECTED NOT DETECTED Final   Bacteroides fragilis NOT DETECTED NOT DETECTED Final   Enterobacterales NOT DETECTED NOT DETECTED Final   Enterobacter cloacae complex NOT DETECTED NOT DETECTED Final   Escherichia coli NOT DETECTED NOT DETECTED Final   Klebsiella aerogenes NOT DETECTED NOT DETECTED Final   Klebsiella oxytoca NOT DETECTED NOT DETECTED Final   Klebsiella pneumoniae NOT DETECTED NOT DETECTED Final   Proteus species NOT DETECTED NOT DETECTED Final   Salmonella species NOT DETECTED NOT DETECTED Final   Serratia marcescens NOT DETECTED NOT DETECTED Final   Haemophilus influenzae NOT DETECTED NOT DETECTED Final   Neisseria meningitidis NOT DETECTED NOT DETECTED Final   Pseudomonas aeruginosa NOT DETECTED NOT DETECTED Final   Stenotrophomonas maltophilia NOT DETECTED NOT DETECTED Final   Candida albicans NOT DETECTED NOT DETECTED Final   Candida  auris NOT DETECTED NOT DETECTED Final   Candida glabrata NOT DETECTED NOT DETECTED Final   Candida krusei NOT DETECTED NOT DETECTED Final   Candida parapsilosis NOT DETECTED NOT DETECTED Final   Candida tropicalis NOT DETECTED NOT DETECTED Final   Cryptococcus neoformans/gattii NOT DETECTED NOT DETECTED Final    Comment: Performed at [facility], [address], [place], [place] [address]    Today   Subjective    Delton Stelle today has no ***          Patient has been seen and examined prior to discharge   Objective   Blood pressure 126/68, pulse 72, temperature 97.7 F (36.5 C), temperature source Oral, resp. rate 18, height 5' 8 (1.727 m), weight 70.3 kg, SpO2 92%.   Intake/Output Summary (Last 24 hours) at 06/03/2024 1342 Last data filed at 06/03/2024 0300 Gross per 24 hour  Intake 240 ml  Output 2905 ml  Net -2665 ml    Exam Gen:- Awake Alert, no acute distress ***  HEENT:- [place].AT, No sclera icterus Neck-Supple Neck,No JVD,.  Lungs-  CTAB , good air movement bilaterally CV- S1, S2 normal, regular Abd-  +ve B.Sounds, Abd Soft, No tenderness,    Extremity/Skin:- No  edema,   good pulses Psych-affect is appropriate, oriented x3 Neuro-no new focal deficits, no tremors ***   Data Review   CBC w Diff:  Lab Results  Component Value Date   WBC 9.7 06/02/2024   HGB 9.7 (L) 06/02/2024   HCT 30.8 (L) 06/02/2024   PLT 333 06/02/2024   LYMPHOPCT 5 05/30/2024   MONOPCT 6 05/30/2024   EOSPCT 0 05/30/2024   BASOPCT 0 05/30/2024    CMP:  Lab Results  Component Value Date   NA 137 06/02/2024   K 4.0 06/02/2024   CL 102 06/02/2024   CO2 28 06/02/2024   BUN 18 06/02/2024   CREATININE 1.23 06/02/2024   PROT 7.4 05/30/2024   ALBUMIN  4.2 05/30/2024   BILITOT 0.3 05/30/2024   ALKPHOS 91 05/30/2024   AST 17 05/30/2024   ALT 11 05/30/2024  .  Total Discharge time is about 33 minutes  Rendall Carwin M.D on 06/03/2024 at 1:42 PM  Go to [email or web address] -  for  contact info  [facility] - Office  [phone]

## 2024-06-03 NOTE — Plan of Care (Signed)

## 2024-06-04 LAB — CULTURE, BLOOD (ROUTINE X 2)
Culture: NO GROWTH
Special Requests: ADEQUATE

## 2024-06-11 ENCOUNTER — Telehealth: Payer: Self-pay

## 2024-06-11 NOTE — Telephone Encounter (Signed)
 Called pt to let him know in order to be scheduled for an appointment he needs to call his PCP and have them place a referral so he can be seen at our office pt voiced his understanding and stated he will work on that today.

## 2024-06-11 NOTE — Telephone Encounter (Signed)
 Pt sister called in attempt to schedule an appt for pt, pt sister made aware due to no DPR on file I am not at liberty to discuss any of patients health concerns or needs pt sister requested a phone call be made to patient to schedule an appt

## 2024-06-18 ENCOUNTER — Emergency Department (HOSPITAL_COMMUNITY)
Admission: EM | Admit: 2024-06-18 | Discharge: 2024-06-18 | Disposition: A | Discharge status: Home / Self Care | Source: Home / Self Care | Attending: Emergency Medicine | Admitting: Emergency Medicine

## 2024-06-18 ENCOUNTER — Encounter (HOSPITAL_COMMUNITY): Payer: Self-pay

## 2024-06-18 ENCOUNTER — Other Ambulatory Visit: Payer: Self-pay

## 2024-06-18 DIAGNOSIS — Z7984 Long term (current) use of oral hypoglycemic drugs: Secondary | ICD-10-CM | POA: Insufficient documentation

## 2024-06-18 DIAGNOSIS — Z7901 Long term (current) use of anticoagulants: Secondary | ICD-10-CM | POA: Diagnosis not present

## 2024-06-18 DIAGNOSIS — Y732 Prosthetic and other implants, materials and accessory gastroenterology and urology devices associated with adverse incidents: Secondary | ICD-10-CM | POA: Insufficient documentation

## 2024-06-18 DIAGNOSIS — Z8673 Personal history of transient ischemic attack (TIA), and cerebral infarction without residual deficits: Secondary | ICD-10-CM | POA: Insufficient documentation

## 2024-06-18 DIAGNOSIS — N390 Urinary tract infection, site not specified: Secondary | ICD-10-CM | POA: Insufficient documentation

## 2024-06-18 DIAGNOSIS — T83511A Infection and inflammatory reaction due to indwelling urethral catheter, initial encounter: Secondary | ICD-10-CM | POA: Diagnosis present

## 2024-06-18 DIAGNOSIS — Z79899 Other long term (current) drug therapy: Secondary | ICD-10-CM | POA: Insufficient documentation

## 2024-06-18 DIAGNOSIS — E119 Type 2 diabetes mellitus without complications: Secondary | ICD-10-CM | POA: Diagnosis not present

## 2024-06-18 DIAGNOSIS — I10 Essential (primary) hypertension: Secondary | ICD-10-CM | POA: Insufficient documentation

## 2024-06-18 DIAGNOSIS — R339 Retention of urine, unspecified: Secondary | ICD-10-CM | POA: Insufficient documentation

## 2024-06-18 DIAGNOSIS — Z794 Long term (current) use of insulin: Secondary | ICD-10-CM | POA: Diagnosis not present

## 2024-06-18 LAB — CBC WITH DIFFERENTIAL/PLATELET
Abs Immature Granulocytes: 0.03 K/uL (ref 0.00–0.07)
Basophils Absolute: 0.1 K/uL (ref 0.0–0.1)
Basophils Relative: 1 %
Eosinophils Absolute: 0.2 K/uL (ref 0.0–0.5)
Eosinophils Relative: 2 %
HCT: 39.6 % (ref 39.0–52.0)
Hemoglobin: 12.6 g/dL — ABNORMAL LOW (ref 13.0–17.0)
Immature Granulocytes: 0 %
Lymphocytes Relative: 26 %
Lymphs Abs: 2.2 K/uL (ref 0.7–4.0)
MCH: 29.8 pg (ref 26.0–34.0)
MCHC: 31.8 g/dL (ref 30.0–36.0)
MCV: 93.6 fL (ref 80.0–100.0)
Monocytes Absolute: 0.6 K/uL (ref 0.1–1.0)
Monocytes Relative: 7 %
Neutro Abs: 5.4 K/uL (ref 1.7–7.7)
Neutrophils Relative %: 64 %
Platelets: 472 K/uL — ABNORMAL HIGH (ref 150–400)
RBC: 4.23 MIL/uL (ref 4.22–5.81)
RDW: 13.2 % (ref 11.5–15.5)
WBC: 8.5 K/uL (ref 4.0–10.5)
nRBC: 0 % (ref 0.0–0.2)

## 2024-06-18 LAB — COMPREHENSIVE METABOLIC PANEL WITH GFR
ALT: 13 U/L (ref 0–44)
AST: 15 U/L (ref 15–41)
Albumin: 4 g/dL (ref 3.5–5.0)
Alkaline Phosphatase: 105 U/L (ref 38–126)
Anion gap: 9 (ref 5–15)
BUN: 11 mg/dL (ref 8–23)
CO2: 28 mmol/L (ref 22–32)
Calcium: 9 mg/dL (ref 8.9–10.3)
Chloride: 101 mmol/L (ref 98–111)
Creatinine, Ser: 0.94 mg/dL (ref 0.61–1.24)
GFR, Estimated: >60 mL/min
Glucose, Bld: 141 mg/dL — ABNORMAL HIGH (ref 70–99)
Potassium: 4.7 mmol/L (ref 3.5–5.1)
Sodium: 139 mmol/L (ref 135–145)
Total Bilirubin: <0.2 mg/dL (ref 0.0–1.2)
Total Protein: 6.8 g/dL (ref 6.5–8.1)

## 2024-06-18 LAB — URINALYSIS, W/ REFLEX TO CULTURE (INFECTION SUSPECTED)
Bacteria, UA: NONE SEEN
Bilirubin Urine: NEGATIVE
Glucose, UA: NEGATIVE mg/dL
Ketones, ur: NEGATIVE mg/dL
Nitrite: NEGATIVE
Protein, ur: NEGATIVE mg/dL
Specific Gravity, Urine: 1.005 (ref 1.005–1.030)
pH: 5 (ref 5.0–8.0)

## 2024-06-18 MED ORDER — CEFDINIR 300 MG PO CAPS: 300.0000 mg | ORAL_CAPSULE | Freq: Two times a day (BID) | ORAL | 0 refills | Status: AC

## 2024-06-18 MED ORDER — PHENAZOPYRIDINE HCL 200 MG PO TABS: 200.0000 mg | ORAL_TABLET | Freq: Three times a day (TID) | ORAL | 0 refills | Status: AC

## 2024-06-18 MED ORDER — SODIUM CHLORIDE 0.9 % IV SOLN
1.0000 g | Freq: Once | INTRAVENOUS | Status: AC
  Administered 2024-06-18: 1 g via INTRAVENOUS
  Filled 2024-06-18: qty 10

## 2024-06-18 MED ORDER — SODIUM CHLORIDE 0.9 % IV BOLUS
1000.0000 mL | Freq: Once | INTRAVENOUS | Status: AC
  Administered 2024-06-18: 1000 mL via INTRAVENOUS

## 2024-06-18 MED ORDER — LIDOCAINE HCL URETHRAL/MUCOSAL 2 % EX GEL
1.0000 | Freq: Once | CUTANEOUS | Status: DC
  Filled 2024-06-18: qty 5

## 2024-06-18 MED ORDER — PHENAZOPYRIDINE HCL 100 MG PO TABS
200.0000 mg | ORAL_TABLET | Freq: Once | ORAL | Status: AC
  Administered 2024-06-18: 200 mg via ORAL
  Filled 2024-06-18: qty 2

## 2024-06-18 MED ORDER — CIPROFLOXACIN HCL 250 MG PO TABS: 500.0000 mg | ORAL_TABLET | Freq: Once | ORAL | Status: DC

## 2024-06-18 NOTE — ED Triage Notes (Signed)
 Pt reports he had a foley placed 3 weeks ago for urinary retention.  Pt reports when he was discharged they never sent a referral to urology so they won't see him and he wants the foley removed.  Pt said he [place] from [place] and did not have all his pills and that's what caused the retention and now he has them all back and wants the foley out.

## 2024-06-18 NOTE — Discharge Instructions (Signed)
 You need to get a referral from your PCP to make an appointment with Dr. Sherrilee (urology)

## 2024-06-18 NOTE — ED Notes (Signed)
 Pt not able to urinate, EDP made aware and bladder scan ordered

## 2024-06-18 NOTE — ED Provider Notes (Signed)
 " [place] EMERGENCY DEPARTMENT AT [facility] Provider Note   CSN: [id number] Arrival date & time: 06/18/24  1311     Patient presents with: foley removal   Jason Graham is a 63 y.o. male.   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  Pt was admitted from 12/11-12/15 for urosepsis and urinary retention.  He had a foley catheter placed when admitted and had over 1L urine in his bladder.  He was d/c with the foley still in place and was told to f/u with urology.  He tried calling urology and they told him he needed a referral from his pcp.  He just moved here and has just made an appt with Dr. Shona, but has not seen him yet.  So, he is here to get the foley removed.  He has had a GI illness for the past 2 days and has had a lot of diarrhea.  However, that is better now.  He thinks he had urinary retention because he was out of his prostate medication.  He's been taking all his meds as directed since d/c.       Prior to Admission medications  Medication Sig Start Date End Date Taking? Authorizing Provider  cefdinir (OMNICEF) 300 MG capsule Take 1 capsule (300 mg total) by mouth 2 (two) times daily for 10 days. 06/18/24 06/28/24 Yes Dean Clarity, MD  phenazopyridine  (PYRIDIUM ) 200 MG tablet Take 1 tablet (200 mg total) by mouth 3 (three) times daily. 06/18/24  Yes Dean Clarity, MD  acetaminophen  (TYLENOL ) 325 MG tablet Take 2 tablets (650 mg total) by mouth every 6 (six) hours as needed for mild pain (pain score 1-3) or fever (or Fever >/= 101). 06/03/24   Pearlean Manus, MD  Alcohol  Swabs (ALCOHOL  WIPES) 70 % PADS 1 application by Does not apply route 4 (four) times daily -  before meals and at bedtime. For blood sugar check 03/11/18   Pearlean Manus, MD  atorvastatin  (LIPITOR) 80 MG tablet Take 1 tablet (80 mg total) by mouth daily. 06/03/24   Pearlean Manus, MD  blood glucose meter kit and supplies Relion Prime or  Dispense other brand based on patient and insurance preference. Use up to four times daily as directed. (FOR ICD-9 250.00, 250.01). 03/11/18   Pearlean Manus, MD  carvedilol  (COREG ) 6.25 MG tablet Take 1 tablet (6.25 mg total) by mouth 2 (two) times daily. 06/03/24   Pearlean Manus, MD  clopidogrel  (PLAVIX ) 75 MG tablet Take 1 tablet (75 mg total) by mouth daily. 06/03/24   Pearlean Manus, MD  finasteride  (PROSCAR ) 5 MG tablet Take 5 mg by mouth daily. 05/16/24   [provider]  furosemide  (LASIX ) 20 MG tablet Take 20 mg by mouth 2 (two) times daily. 05/07/24   [provider]  gabapentin  (NEURONTIN ) 100 MG capsule Take 200 mg by mouth 3 (three) times daily. 05/10/24   [provider]  HYDROcodone -acetaminophen  (NORCO/VICODIN) 5-325 MG tablet Take 1 tablet by mouth in the morning and at bedtime. 04/22/24   [provider]  insulin  glargine (LANTUS ) 100 UNIT/ML Solostar Pen Inject 10 Units into the skin at bedtime. 06/03/24   Pearlean Manus, MD  JANUVIA  100 MG tablet Take 1 tablet (100 mg total) by mouth daily. 06/03/24   Pearlean Manus, MD  lactobacillus acidophilus & bulgar (LACTINEX) chewable tablet Chew 1 tablet by mouth 3 (three) times daily with meals. 03/11/18   Emokpae,  Courage, MD  lisinopril  (ZESTRIL ) 5 MG tablet Take 1 tablet (5 mg total) by mouth daily. 06/03/24   Pearlean Manus, MD  metFORMIN  (GLUMETZA ) 1000 MG (MOD) 24 hr tablet Take 1 tablet (1,000 mg total) by mouth 2 (two) times daily. 06/03/24   Pearlean Manus, MD  NIFEdipine  (PROCARDIA  XL/NIFEDICAL-XL) 90 MG 24 hr tablet Take 1 tablet (90 mg total) by mouth daily. 06/03/24   Pearlean Manus, MD  polyethylene glycol (MIRALAX ) 17 g packet Take 17 g by mouth daily. 06/03/24   Pearlean Manus, MD  Potassium Chloride  ER 20 MEQ TBCR Take 1 tablet (20 mEq total) by mouth daily. 1 tab daily by mouth--- take while taking Lasix /furosemide  06/03/24   Emokpae, Courage, MD  senna-docusate  (SENOKOT-S) 8.6-50 MG tablet Take 2 tablets by mouth at bedtime. 06/03/24   Pearlean Manus, MD  tamsulosin  (FLOMAX ) 0.4 MG CAPS capsule Take 2 capsules (0.8 mg total) by mouth daily. 06/03/24   Pearlean Manus, MD  XARELTO  2.5 MG TABS tablet Take 1 tablet (2.5 mg total) by mouth 2 (two) times daily. 06/03/24   Pearlean Manus, MD    Allergies: Patient has no known allergies.    Review of Systems  Genitourinary:  Positive for dysuria.  All other systems reviewed and are negative.   Updated Vital Signs BP (!) 147/64 (BP Location: Left Arm)   Pulse 83   Temp 98.3 F (36.8 C) (Oral)   Resp 18   Wt 70.3 kg   SpO2 98%   BMI 23.57 kg/m   Physical Exam Vitals and nursing note reviewed.  Constitutional:      Appearance: Normal appearance.  HENT:     Head: Normocephalic and atraumatic.     Right Ear: External ear normal.     Left Ear: External ear normal.     Nose: Nose normal.     Mouth/Throat:     Mouth: Mucous membranes are moist.     Pharynx: Oropharynx is clear.  Eyes:     Extraocular Movements: Extraocular movements intact.     Conjunctiva/sclera: Conjunctivae normal.     Pupils: Pupils are equal, round, and reactive to light.  Cardiovascular:     Rate and Rhythm: Normal rate and regular rhythm.     Pulses: Normal pulses.     Heart sounds: Normal heart sounds.  Pulmonary:     Effort: Pulmonary effort is normal.     Breath sounds: Normal breath sounds.  Abdominal:     General: Abdomen is flat. Bowel sounds are normal.     Palpations: Abdomen is soft.  Musculoskeletal:        General: Normal range of motion.     Cervical back: Normal range of motion and neck supple.  Skin:    General: Skin is warm.     Capillary Refill: Capillary refill takes less than 2 seconds.  Neurological:     Mental Status: He is alert and oriented to person, place, and time.     Comments: Right sided weakness (chronic)     (all labs ordered are listed, but only abnormal results are  displayed) Labs Reviewed  CBC WITH DIFFERENTIAL/PLATELET - Abnormal; Notable for the following components:      Result Value   Hemoglobin 12.6 (*)    Platelets 472 (*)    All other components within normal limits  COMPREHENSIVE METABOLIC PANEL WITH GFR - Abnormal; Notable for the following components:   Glucose, Bld 141 (*)    All other components within normal limits  URINALYSIS, W/ REFLEX TO CULTURE (INFECTION SUSPECTED) - Abnormal; Notable for the following components:   Color, Urine STRAW (*)    Hgb urine dipstick LARGE (*)    Leukocytes,Ua SMALL (*)    All other components within normal limits  URINE CULTURE    EKG: None  Radiology: No results found.   Procedures   Medications Ordered in the ED  lidocaine  (XYLOCAINE ) 2 % jelly 1 Application (has no administration in time range)  phenazopyridine  (PYRIDIUM ) tablet 200 mg (has no administration in time range)  cefTRIAXone  (ROCEPHIN ) 1 g in sodium chloride  0.9 % 100 mL IVPB (has no administration in time range)  sodium chloride  0.9 % bolus 1,000 mL (0 mLs Intravenous Stopped 06/18/24 1809)                                    Medical Decision Making Amount and/or Complexity of Data Reviewed Labs: ordered.  Risk Prescription drug management.   This patient presents to the ED for concern of foley catheter removal, this involves an extensive number of treatment options, and is a complaint that carries with it a high risk of complications and morbidity.  The differential diagnosis includes uti, urinary retention, dehydration   Co morbidities that complicate the patient evaluation   htn, dm, arthritis, hld, bph, cva, pad (on Xarelto )   Additional history obtained:  Additional history obtained from epic chart review External records from outside source obtained and reviewed including wife   Lab Tests:  I Ordered, and personally interpreted labs.  The pertinent results include:  cbc nl other than mild anemia with  hgb 12.6, cmp nl other than slight elevation of bs at 141  Medicines ordered and prescription drug management:  I ordered medication including ivfs  for sx  Reevaluation of the patient after these medicines showed that the patient improved I have reviewed the patients home medicines and have made adjustments as needed  Critical Interventions:  foley  Problem List / ED Course:  Urinary retention:  pt's foley removed per his request.  He was unable to urinate after 1 L NS and Mtn. Dew.  Bladder scan showed almost 700 cc urine.  Foley replaced.  Pt does have a uti.  Urine cx from adm showed proteus MDRO, but sensitive to keflex  which is what he was d/c with.  I am going to give him a dose of rocephin  and then d/c with omnicef as the keflex  did not get rid of the uti.  Pt is stable for d/c.  He knows to get a urology referral from his pcp.  Return if worse.   Reevaluation:  After the interventions noted above, I reevaluated the patient and found that they have :improved   Social Determinants of Health:  Lives at home   Dispostion:  After consideration of the diagnostic results and the patients response to treatment, I feel that the patent would benefit from discharge with outpatient f/u.       Final diagnoses:  Urinary retention  Urinary tract infection associated with indwelling urethral catheter, initial encounter    ED Discharge Orders          Ordered    cefdinir (OMNICEF) 300 MG capsule  2 times daily        06/18/24 1946    phenazopyridine  (PYRIDIUM ) 200 MG tablet  3 times daily        06/18/24 1947  Dean Clarity, MD 06/18/24 1948  "

## 2024-06-20 LAB — URINE CULTURE: Culture: NO GROWTH

## 2024-06-25 ENCOUNTER — Other Ambulatory Visit: Payer: Self-pay

## 2024-06-25 ENCOUNTER — Emergency Department (HOSPITAL_COMMUNITY): Admission: EM | Admit: 2024-06-25 | Discharge: 2024-06-25 | Disposition: A | Discharge status: Home / Self Care

## 2024-06-25 ENCOUNTER — Encounter (HOSPITAL_COMMUNITY): Payer: Self-pay | Admitting: *Deleted

## 2024-06-25 DIAGNOSIS — Z7901 Long term (current) use of anticoagulants: Secondary | ICD-10-CM | POA: Insufficient documentation

## 2024-06-25 DIAGNOSIS — Z794 Long term (current) use of insulin: Secondary | ICD-10-CM | POA: Insufficient documentation

## 2024-06-25 DIAGNOSIS — G629 Polyneuropathy, unspecified: Secondary | ICD-10-CM | POA: Diagnosis not present

## 2024-06-25 DIAGNOSIS — Z7984 Long term (current) use of oral hypoglycemic drugs: Secondary | ICD-10-CM | POA: Insufficient documentation

## 2024-06-25 DIAGNOSIS — Z79899 Other long term (current) drug therapy: Secondary | ICD-10-CM | POA: Diagnosis not present

## 2024-06-25 DIAGNOSIS — R319 Hematuria, unspecified: Secondary | ICD-10-CM | POA: Diagnosis present

## 2024-06-25 DIAGNOSIS — Z7902 Long term (current) use of antithrombotics/antiplatelets: Secondary | ICD-10-CM | POA: Diagnosis not present

## 2024-06-25 LAB — URINALYSIS, ROUTINE W REFLEX MICROSCOPIC
Bilirubin Urine: NEGATIVE
Glucose, UA: NEGATIVE mg/dL
Ketones, ur: NEGATIVE mg/dL
Nitrite: NEGATIVE
Protein, ur: 100 mg/dL — AB
RBC / HPF: >50 RBC/hpf (ref 0–5)
Specific Gravity, Urine: 1.014 (ref 1.005–1.030)
WBC, UA: >50 WBC/hpf (ref 0–5)
pH: 6 (ref 5.0–8.0)

## 2024-06-25 MED ORDER — GABAPENTIN 300 MG PO CAPS
600.0000 mg | ORAL_CAPSULE | Freq: Once | ORAL | Status: AC
  Administered 2024-06-25: 600 mg via ORAL
  Filled 2024-06-25: qty 2

## 2024-06-25 MED ORDER — GABAPENTIN 600 MG PO TABS: 600.0000 mg | ORAL_TABLET | Freq: Three times a day (TID) | ORAL | 0 refills | Status: AC

## 2024-06-25 NOTE — Discharge Instructions (Addendum)
 Your UA was negative for urinary tract infection.  Take your gabapentin  as needed.  Follow-up with urology, call the office to make an appointment.  A referral was placed in the emergency department.

## 2024-06-25 NOTE — ED Provider Notes (Signed)
 " [place] EMERGENCY DEPARTMENT AT [facility] Provider Note   CSN: [id number] Arrival date & time: 06/25/24  1454     Patient presents with: Hematuria   Jason Graham is a 64 y.o. male.  {Add pertinent medical, surgical, social history, OB history to HPI:468} 64 year old male presents for evaluation of hematuria.  Had a Foley catheter placed yesterday for urinary retention.  States is on a blood thinner.  States today he had some painless hematuria that is now resolved.  Denies any other symptoms or concerns.  Does state he has been on gabapentin  but is new to the area and has run out and is requesting refill of his gabapentin  for his neuropathy.   Hematuria Pertinent negatives include no chest pain, no abdominal pain and no shortness of breath.       Prior to Admission medications  Medication Sig Start Date End Date Taking? Authorizing Provider  acetaminophen  (TYLENOL ) 325 MG tablet Take 2 tablets (650 mg total) by mouth every 6 (six) hours as needed for mild pain (pain score 1-3) or fever (or Fever >/= 101). 06/03/24   Pearlean Manus, MD  Alcohol  Swabs (ALCOHOL  WIPES) 70 % PADS 1 application by Does not apply route 4 (four) times daily -  before meals and at bedtime. For blood sugar check 03/11/18   Pearlean Manus, MD  atorvastatin  (LIPITOR) 80 MG tablet Take 1 tablet (80 mg total) by mouth daily. 06/03/24   Pearlean Manus, MD  blood glucose meter kit and supplies Relion Prime or Dispense other brand based on patient and insurance preference. Use up to four times daily as directed. (FOR ICD-9 250.00, 250.01). 03/11/18   Pearlean Manus, MD  carvedilol  (COREG ) 6.25 MG tablet Take 1 tablet (6.25 mg total) by mouth 2 (two) times daily. 06/03/24   Pearlean Manus, MD  cefdinir  (OMNICEF ) 300 MG capsule Take 1 capsule (300 mg total) by mouth 2 (two) times daily for 10 days. 06/18/24 06/28/24  Dean Clarity, MD  clopidogrel  (PLAVIX ) 75 MG tablet Take 1 tablet (75 mg total)  by mouth daily. 06/03/24   Pearlean Manus, MD  finasteride  (PROSCAR ) 5 MG tablet Take 5 mg by mouth daily. 05/16/24   [provider]  furosemide  (LASIX ) 20 MG tablet Take 20 mg by mouth 2 (two) times daily. 05/07/24   [provider]  gabapentin  (NEURONTIN ) 100 MG capsule Take 200 mg by mouth 3 (three) times daily. 05/10/24   [provider]  HYDROcodone -acetaminophen  (NORCO/VICODIN) 5-325 MG tablet Take 1 tablet by mouth in the morning and at bedtime. 04/22/24   [provider]  insulin  glargine (LANTUS ) 100 UNIT/ML Solostar Pen Inject 10 Units into the skin at bedtime. 06/03/24   Pearlean Manus, MD  JANUVIA  100 MG tablet Take 1 tablet (100 mg total) by mouth daily. 06/03/24   Pearlean Manus, MD  lactobacillus acidophilus & bulgar (LACTINEX) chewable tablet Chew 1 tablet by mouth 3 (three) times daily with meals. 03/11/18   Pearlean Manus, MD  lisinopril  (ZESTRIL ) 5 MG tablet Take 1 tablet (5 mg total) by mouth daily. 06/03/24   Pearlean Manus, MD  metFORMIN  (GLUMETZA ) 1000 MG (MOD) 24 hr tablet Take 1 tablet (1,000 mg total) by mouth 2 (two) times daily. 06/03/24   Pearlean Manus, MD  NIFEdipine  (PROCARDIA  XL/NIFEDICAL-XL) 90 MG 24 hr tablet Take 1 tablet (90 mg total) by mouth daily. 06/03/24   Pearlean Manus, MD  phenazopyridine  (PYRIDIUM ) 200 MG tablet Take 1 tablet (200 mg total) by mouth 3 (  three) times daily. 06/18/24   Haviland, Julie, MD  polyethylene glycol (MIRALAX ) 17 g packet Take 17 g by mouth daily. 06/03/24   Pearlean Manus, MD  Potassium Chloride  ER 20 MEQ TBCR Take 1 tablet (20 mEq total) by mouth daily. 1 tab daily by mouth--- take while taking Lasix /furosemide  06/03/24   Emokpae, Courage, MD  senna-docusate (SENOKOT-S) 8.6-50 MG tablet Take 2 tablets by mouth at bedtime. 06/03/24   Pearlean Manus, MD  tamsulosin  (FLOMAX ) 0.4 MG CAPS capsule Take 2 capsules (0.8 mg total) by mouth daily. 06/03/24   Pearlean Manus, MD  XARELTO   2.5 MG TABS tablet Take 1 tablet (2.5 mg total) by mouth 2 (two) times daily. 06/03/24   Pearlean Manus, MD    Allergies: Patient has no known allergies.    Review of Systems  Constitutional:  Negative for chills and fever.  HENT:  Negative for ear pain and sore throat.   Eyes:  Negative for pain and visual disturbance.  Respiratory:  Negative for cough and shortness of breath.   Cardiovascular:  Negative for chest pain and palpitations.  Gastrointestinal:  Negative for abdominal pain and vomiting.  Genitourinary:  Positive for hematuria. Negative for dysuria.  Musculoskeletal:  Negative for arthralgias and back pain.  Skin:  Negative for color change and rash.  Neurological:  Negative for seizures and syncope.  All other systems reviewed and are negative.   Updated Vital Signs BP 120/66   Pulse 80   Temp 98.1 F (36.7 C) (Oral)   Resp 19   Ht 5' 8 (1.727 m)   Wt 70.3 kg   SpO2 98%   BMI 23.57 kg/m   Physical Exam Vitals and nursing note reviewed.  Constitutional:      General: He is not in acute distress.    Appearance: He is well-developed.  HENT:     Head: Normocephalic and atraumatic.  Eyes:     Conjunctiva/sclera: Conjunctivae normal.  Cardiovascular:     Rate and Rhythm: Normal rate and regular rhythm.     Pulses: Normal pulses.     Heart sounds: Normal heart sounds. No murmur heard. Pulmonary:     Effort: Pulmonary effort is normal. No respiratory distress.     Breath sounds: Normal breath sounds.  Abdominal:     Palpations: Abdomen is soft.     Tenderness: There is no abdominal tenderness.  Musculoskeletal:        General: No swelling.     Cervical back: Neck supple.  Skin:    General: Skin is warm and dry.     Capillary Refill: Capillary refill takes less than 2 seconds.  Neurological:     General: No focal deficit present.     Mental Status: He is alert.  Psychiatric:        Mood and Affect: Mood normal.     (all labs ordered are listed,  but only abnormal results are displayed) Labs Reviewed  URINALYSIS, ROUTINE W REFLEX MICROSCOPIC    EKG: None  Radiology: No results found.  {Document cardiac monitor, telemetry assessment procedure when appropriate:32947} Procedures   Medications Ordered in the ED  gabapentin  (NEURONTIN ) capsule 600 mg (has no administration in time range)      {Click here for ABCD2, HEART and other calculators REFRESH Note before signing:1}                              Medical Decision Making Amount and/or  Complexity of Data Reviewed Labs: ordered.  Risk Prescription drug management.   ***  {Document critical care time when appropriate  Document review of labs and clinical decision tools ie CHADS2VASC2, etc  Document your independent review of radiology images and any outside records  Document your discussion with family members, caretakers and with consultants  Document social determinants of health affecting pt's care  Document your decision making why or why not admission, treatments were needed:32947:::1}   Final diagnoses:  None    ED Discharge Orders     None        "

## 2024-06-25 NOTE — ED Triage Notes (Signed)
 Pt with foley catheter in place, noted blood in urine since last night. Pt denies pulling of tubing.

## 2024-07-01 ENCOUNTER — Telehealth: Payer: Self-pay

## 2024-07-01 NOTE — Telephone Encounter (Signed)
 Called pt to scheduled from referral. Pt is scheduled and was offer appointment for [facility] for sooner appointment. Pt decline and state's he will go to the hospital for cath changed. Pt voiced understanding.

## 2024-07-03 ENCOUNTER — Ambulatory Visit: Admitting: Urology

## 2024-07-03 VITALS — BP 162/79 | HR 81

## 2024-07-03 DIAGNOSIS — N401 Enlarged prostate with lower urinary tract symptoms: Secondary | ICD-10-CM

## 2024-07-03 DIAGNOSIS — R338 Other retention of urine: Secondary | ICD-10-CM | POA: Diagnosis not present

## 2024-07-03 DIAGNOSIS — N138 Other obstructive and reflux uropathy: Secondary | ICD-10-CM | POA: Diagnosis not present

## 2024-07-03 DIAGNOSIS — R339 Retention of urine, unspecified: Secondary | ICD-10-CM

## 2024-07-03 MED ORDER — TAMSULOSIN HCL 0.4 MG PO CAPS: 0.4000 mg | ORAL_CAPSULE | Freq: Two times a day (BID) | ORAL | 11 refills | Status: DC

## 2024-07-03 MED ORDER — FINASTERIDE 5 MG PO TABS: 5.0000 mg | ORAL_TABLET | Freq: Every day | ORAL | 3 refills | Status: AC

## 2024-07-03 NOTE — Progress Notes (Signed)
 "  07/03/2024 10:10 AM   Jason Graham Dec 29, 1960 [id number]  Referring provider: Pearlean Manus, MD [address] [place],  [place] [address]  Urinary retention  HPI: Jason Graham is a 63yo here for evaluation of urinary retention. He was admitted to [facility] 1 month with sepsis from a urinary source and AKI. He was found to be in urinary retention and had 1.5L drained. No current BPH therapy. He had issues with constipation over the past 2 years and when his constipation is worse he straining to urinate was worse. He was started on finasteride  several years ago while in [place] . He is no longer on the medication.    PMH: Past Medical History:  Diagnosis Date   Arthritis    Diabetes mellitus without complication (HCC)    Hypertension     Surgical History: Past Surgical History:  Procedure Laterality Date   LAPAROTOMY N/A 04/07/2013   Procedure: EXPLORATORY LAPAROTOMY, REPAIR OF SMALL BOWEL ENTEROTOMY, REPAIR OF FASCIAL DEFECT;  Surgeon: Jason DELENA Poli, MD;  Location: [facility] OR;  Service: General;  Laterality: N/A;    Home Medications:  Allergies as of 07/03/2024   No Known Allergies      Medication List        Accurate as of July 03, 2024 10:10 AM. If you have any questions, ask your nurse or doctor.          acetaminophen  325 MG tablet Commonly known as: TYLENOL  Take 2 tablets (650 mg total) by mouth every 6 (six) hours as needed for mild pain (pain score 1-3) or fever (or Fever >/= 101).   Alcohol  Wipes 70 % Pads 1 application by Does not apply route 4 (four) times daily -  before meals and at bedtime. For blood sugar check   atorvastatin  80 MG tablet Commonly known as: LIPITOR Take 1 tablet (80 mg total) by mouth daily.   blood glucose meter kit and supplies Relion Prime or Dispense other brand based on patient and insurance preference. Use up to four times daily as directed. (FOR ICD-9 250.00, 250.01).   carvedilol  6.25 MG tablet Commonly known as:  COREG  Take 1 tablet (6.25 mg total) by mouth 2 (two) times daily.   clopidogrel  75 MG tablet Commonly known as: PLAVIX  Take 1 tablet (75 mg total) by mouth daily.   finasteride  5 MG tablet Commonly known as: PROSCAR  Take 5 mg by mouth daily.   furosemide  20 MG tablet Commonly known as: LASIX  Take 20 mg by mouth 2 (two) times daily.   gabapentin  100 MG capsule Commonly known as: NEURONTIN  Take 200 mg by mouth 3 (three) times daily.   gabapentin  600 MG tablet Commonly known as: Neurontin  Take 1 tablet (600 mg total) by mouth 3 (three) times daily.   HYDROcodone -acetaminophen  5-325 MG tablet Commonly known as: NORCO/VICODIN Take 1 tablet by mouth in the morning and at bedtime.   insulin  glargine 100 UNIT/ML Solostar Pen Commonly known as: LANTUS  Inject 10 Units into the skin at bedtime.   Januvia  100 MG tablet Generic drug: sitaGLIPtin  Take 1 tablet (100 mg total) by mouth daily.   lactobacillus acidophilus & bulgar chewable tablet Chew 1 tablet by mouth 3 (three) times daily with meals.   lisinopril  5 MG tablet Commonly known as: ZESTRIL  Take 1 tablet (5 mg total) by mouth daily.   metFORMIN  1000 MG (MOD) 24 hr tablet Commonly known as: GLUMETZA  Take 1 tablet (1,000 mg total) by mouth 2 (two) times daily.  NIFEdipine  90 MG 24 hr tablet Commonly known as: PROCARDIA  XL/NIFEDICAL-XL Take 1 tablet (90 mg total) by mouth daily.   phenazopyridine  200 MG tablet Commonly known as: PYRIDIUM  Take 1 tablet (200 mg total) by mouth 3 (three) times daily.   polyethylene glycol 17 g packet Commonly known as: MiraLax  Take 17 g by mouth daily.   Potassium Chloride  ER 20 MEQ Tbcr Take 1 tablet (20 mEq total) by mouth daily. 1 tab daily by mouth--- take while taking Lasix /furosemide    senna-docusate 8.6-50 MG tablet Commonly known as: Senokot-S Take 2 tablets by mouth at bedtime.   tamsulosin  0.4 MG Caps capsule Commonly known as: FLOMAX  Take 2 capsules (0.8 mg total)  by mouth daily.   Xarelto  2.5 MG Tabs tablet Generic drug: rivaroxaban  Take 1 tablet (2.5 mg total) by mouth 2 (two) times daily.        Allergies: Allergies[1]  Family History: Family History  Problem Relation Age of Onset   Diabetes Mother    Heart disease Mother    Diabetes Father    Heart disease Father     Social History:  reports that he has been smoking. He has never used smokeless tobacco. He reports current alcohol  use. He reports that he does not use drugs.  ROS: All other review of systems were reviewed and are negative except what is noted above in HPI  Physical Exam: BP (!) 162/79 Comment: md aware  pt advised to f/u with pcp  Pulse 81   Constitutional:  Alert and oriented, No acute distress. HEENT: [place] AT, moist mucus membranes.  Trachea midline, no masses. Cardiovascular: No clubbing, cyanosis, or edema. Respiratory: Normal respiratory effort, no increased work of breathing. GI: Abdomen is soft, nontender, nondistended, no abdominal masses GU: No CVA tenderness.  Lymph: No cervical or inguinal lymphadenopathy. Skin: No rashes, bruises or suspicious lesions. Neurologic: Grossly intact, no focal deficits, moving all 4 extremities. Psychiatric: Normal mood and affect.  Laboratory Data: Lab Results  Component Value Date   WBC 8.5 06/18/2024   HGB 12.6 (L) 06/18/2024   HCT 39.6 06/18/2024   MCV 93.6 06/18/2024   PLT 472 (H) 06/18/2024    Lab Results  Component Value Date   CREATININE 0.94 06/18/2024    No results found for: PSA  No results found for: TESTOSTERONE  Lab Results  Component Value Date   HGBA1C 6.5 (H) 05/31/2024    Urinalysis    Component Value Date/Time   COLORURINE AMBER (A) 06/25/2024 2015   APPEARANCEUR CLOUDY (A) 06/25/2024 2015   LABSPEC 1.014 06/25/2024 2015   PHURINE 6.0 06/25/2024 2015   GLUCOSEU NEGATIVE 06/25/2024 2015   HGBUR LARGE (A) 06/25/2024 2015   BILIRUBINUR NEGATIVE 06/25/2024 2015   KETONESUR  NEGATIVE 06/25/2024 2015   PROTEINUR 100 (A) 06/25/2024 2015   NITRITE NEGATIVE 06/25/2024 2015   LEUKOCYTESUR MODERATE (A) 06/25/2024 2015    Lab Results  Component Value Date   BACTERIA RARE (A) 06/25/2024    Pertinent Imaging:  No results found for this or any previous visit.  No results found for this or any previous visit.  No results found for this or any previous visit.  No results found for this or any previous visit.  Results for orders placed during the hospital encounter of 05/30/24  US  RENAL  Narrative EXAM: US  Retroperitoneum Complete, Renal. 05/31/2024 09:26:36 AM  TECHNIQUE: Real-time ultrasonography of the retroperitoneum renal was performed.  COMPARISON: None available  CLINICAL HISTORY: AKI (acute kidney injury).  FINDINGS:  FINDINGS: RIGHT KIDNEY/URETER: Right kidney measures 11.2 x 5.2 x 5.5 cm (168 mL). Normal cortical echogenicity. No hydronephrosis. No calculus. No mass.  LEFT KIDNEY/URETER: Left kidney measures 11.6 x 6.6 x 5.4 cm (216 mL). Normal cortical echogenicity. Moderate left hydronephrosis. No calculus. No mass.  BLADDER: Bladder has a thick wall but is decompressed by an indwelling foley catheter.  IMPRESSION: 1. Moderate left hydronephrosis. 2. Bladder decompressed by an indwelling Foley catheter.  Electronically signed by: Pinkie Pebbles MD 05/31/2024 08:08 PM EST RP Workstation: [id number]  No results found for this or any previous visit.  No results found for this or any previous visit.  No results found for this or any previous visit.   Assessment & Plan:    1. Urinary retention (Primary) -Flomax  0.4mg  BID and finasteride  5mg  dail Followup 1 week for voiding trial  2. Benign prostatic hyperplasia with urinary obstruction Flomax  0.4mg  BID and finasteride  5mg  daily   No follow-ups on file.  Belvie Clara, MD  [facility] [place]       [1] No Known Allergies  "

## 2024-07-04 ENCOUNTER — Encounter: Payer: Self-pay | Admitting: Urology

## 2024-07-04 NOTE — Patient Instructions (Signed)

## 2024-07-09 ENCOUNTER — Ambulatory Visit

## 2024-07-09 ENCOUNTER — Telehealth: Payer: Self-pay

## 2024-07-09 DIAGNOSIS — R339 Retention of urine, unspecified: Secondary | ICD-10-CM

## 2024-07-09 LAB — BLADDER SCAN AMB NON-IMAGING: Scan Result: 420

## 2024-07-09 MED ORDER — CIPROFLOXACIN HCL 500 MG PO TABS
500.0000 mg | ORAL_TABLET | Freq: Once | ORAL | Status: AC
  Administered 2024-07-09: 500 mg via ORAL

## 2024-07-09 NOTE — Progress Notes (Addendum)
 Fill and Pull Catheter Removal  Patient is present today for a catheter removal due to urinary retention.  400 ml of sterile water was instilled into the bladder when the patient felt the urge to urinate. 10 ml of water was then drained from the balloon.  A 16 FR foley cath was removed from the bladder no complications were noted .  Foley catheter intact and time of removal. Patient as then given some time to void on their own.  Patient cannot void  0 ml on their own after some time.  Patient tolerated well.  One oral prophylactic antibiotic given per MD orders   Bladder Scan completed today due to reason Urinary retention  Patient cannot void prior to the bladder scan. Bladder scan result: 420 Simple Catheter Placement  Due to urinary retention patient is present today for a foley cath placement.  Patient was cleaned and prepped in a sterile fashion with Betadinex3 . A 16  FR foley catheter was inserted, urine return was noted  400 ml, urine was Clear yellow in color.  The balloon was filled with 10cc of sterile water.  A night  bag was attached for drainage. Patient was also given a night bag to take home and was given instruction on how to change from one bag to another.  Patient was given instruction on proper catheter care.  Patient tolerated well, no complications were noted   Performed by: Carlos, CMA  Additional notes/ Follow up:

## 2024-07-09 NOTE — Telephone Encounter (Signed)
 Patient came in today for VT. Pt was unable to voided after 15-20 minutes after cath was removed. PVR was 420  Per Dr. Matilda replaced cath. Pt is aware a message will be sent to Dr. Sherrilee on how he would he like for pt to proceed.. Pt is aware I will reach back out after I get a response from MD. Voiced understanding

## 2024-07-17 MED ORDER — SILODOSIN 8 MG PO CAPS: 8.0000 mg | ORAL_CAPSULE | Freq: Every day | ORAL | 11 refills | Status: AC

## 2024-07-17 NOTE — Addendum Note (Signed)
 Addended by: GRETTA MASTERS R on: 07/17/2024 08:55 AM   Modules accepted: Orders

## 2024-07-17 NOTE — Telephone Encounter (Signed)
 Pt made aware and voiced understanding Try rapaflo  8mg  daily

## 2024-07-18 ENCOUNTER — Telehealth: Payer: Self-pay | Admitting: Urology

## 2024-07-18 NOTE — Telephone Encounter (Signed)
 Patient called wanting to know when his next appointment will be, looks like he failed voiding trial and no future appointment was made

## 2024-07-19 NOTE — Telephone Encounter (Signed)
 Tried to inform patient of next appt, mailbox is full, sent letter

## 2024-07-19 NOTE — Telephone Encounter (Signed)
 Per MD possible cysto is needed.  Please inform patient of scheduled appt and direct any questions to clinical staff.  Thank you

## 2024-08-12 ENCOUNTER — Ambulatory Visit: Admitting: Urology

## 2024-09-13 ENCOUNTER — Ambulatory Visit: Admitting: Urology
